# Patient Record
Sex: Male | Born: 1965 | Race: Black or African American | Hispanic: No | Marital: Married | Smoking: Former smoker
Health system: Southern US, Community
[De-identification: ages and names within clinical notes are randomized; demographics above are authoritative.]

## PROBLEM LIST (undated history)

## (undated) DIAGNOSIS — E785 Hyperlipidemia, unspecified: Secondary | ICD-10-CM

## (undated) DIAGNOSIS — I10 Essential (primary) hypertension: Secondary | ICD-10-CM

## (undated) DIAGNOSIS — T7840XA Allergy, unspecified, initial encounter: Secondary | ICD-10-CM

## (undated) DIAGNOSIS — E119 Type 2 diabetes mellitus without complications: Secondary | ICD-10-CM

## (undated) HISTORY — DX: Essential (primary) hypertension: I10

## (undated) HISTORY — DX: Type 2 diabetes mellitus without complications: E11.9

## (undated) HISTORY — DX: Allergy, unspecified, initial encounter: T78.40XA

## (undated) HISTORY — PX: NO PAST SURGERIES: SHX2092

---

## 2014-01-15 LAB — AMB EXT HGBA1C: Hemoglobin A1c, External: 7.4

## 2014-01-15 LAB — AMB EXT LDL-C: LDL-C, External: 112

## 2014-01-15 LAB — AMB EXT CREATININE: Creatinine, External: 1.2

## 2014-06-07 LAB — AMB POC URINALYSIS DIP STICK AUTO W/O MICRO
Bilirubin (UA POC): NEGATIVE
Blood (UA POC): NEGATIVE
Leukocyte esterase (UA POC): NEGATIVE
Nitrites (UA POC): NEGATIVE
Protein (UA POC): NEGATIVE mg/dL
Specific gravity (UA POC): 1.025 (ref 1.001–1.035)
Urobilinogen (UA POC): 0.2 (ref 0.2–1)
pH (UA POC): 5.5 (ref 4.6–8.0)

## 2014-06-07 LAB — AMB POC URINE, MICROALBUMIN, SEMIQUANT (5 RESULTS)
CREATININE, URINE POC: 300 mg/dL
Microalbumin urine (POC): 30 mg/L
Microalbumin/creat ratio (POC): 30 mg/g

## 2014-06-07 LAB — AMB POC GLUCOSE BLOOD, BY GLUCOSE MONITORING DEVICE: Glucose POC: 294 mg/dL

## 2014-06-07 MED ORDER — METFORMIN ER 1,000 MG 24 HR TABLET,EXTENDED RELEASE
1000 mg | ORAL_TABLET | Freq: Every day | ORAL | Status: DC
Start: 2014-06-07 — End: 2014-06-18

## 2014-06-07 MED ORDER — LISINOPRIL 20 MG TAB
20 mg | ORAL_TABLET | Freq: Every day | ORAL | Status: DC
Start: 2014-06-07 — End: 2014-06-18

## 2014-06-07 NOTE — Progress Notes (Signed)
Pt it also taking lisinopril

## 2014-06-07 NOTE — Progress Notes (Signed)
Chief Complaint   Patient presents with   ??? New Patient     establish a PCP           Subjective:   Jonathan Watts 48 y.o.  male with a  past medical history reviewed see below. comes in today to establish care   He was told by his last doctor and was started on three medications for his diabeties and he has not been taking the medication as he does not feel like he has diabeties he has been dx with HTN and was on two medications for the HTN he notes he has not taken any medications for 3 months and his home bp's are 160/105 and lowest 140/91.   C/o rightt sided back pain and numbness and tingling in hands when waking up and left shoulder pain no red flags     ROS: otherwise feeling generally well. All other systems reviewed and are negative      Current Outpatient Prescriptions   Medication Sig Dispense Refill   ??? ergocalciferol (ERGOCALCIFEROL) 50,000 unit capsule Take 1 Cap by mouth every seven (7) days. For twelve weeks once a week 12 Cap 0   ??? metFORMIN (GLUCOPHAGE) 500 mg tablet Take 1 Tab by mouth daily (with dinner). Use if your blood sugar is > 150 before dinner or take when you consume ETOH 30 Tab 0   ??? Blood-Glucose Meter (CONTOUR METER) monitoring kit Dx NIDDM 1 Kit 0   ??? glucose blood VI test strips (ASCENSIA CONTOUR) strip Use daily as directed 1 Each 11   ??? metoprolol succinate (TOPROL-XL) 25 mg XL tablet Take 1 Tab by mouth daily. 30 Tab 1   ??? lisinopril (PRINIVIL, ZESTRIL) 20 mg tablet Take 1 Tab by mouth daily. 30 Tab 1   ??? empagliflozin (JARDIANCE) 25 mg tablet Take 1 Tab by mouth daily. 30 Tab 4   ??? atorvastatin (LIPITOR) 10 mg tablet Take 1 Tab by mouth daily. 30 Tab 3     Allergies   Allergen Reactions   ??? Hay Fever & Allergy Relief Sneezing     Past Medical History   Diagnosis Date   ??? Hypertension    ??? Diabetes ([place])      History reviewed. No pertinent past surgical history.  Family History   Problem Relation Age of Onset   ??? Diabetes Mother    ??? Heart Disease Father      History    Substance Use Topics   ??? Smoking status: Never Smoker    ??? Smokeless tobacco: Never Used   ??? Alcohol Use: Yes          Objective:   BP 166/105 mmHg   Pulse 71   Temp(Src) 97.6 ??F (36.4 ??C) (Oral)   Resp 14   Wt 221 lb (100.245 kg)   SpO2 97%  Gen: NAD, pleasant  HEENT: normal appearing head, nares patent, PERRLA, EOMI, oropharynx no erythema, no cervical lymphadenopathy neck supple   Cardio: RRR nl S1S2 no murmur  Lungs CTAB no wheeze no rales no rhonchi  ABD Soft non tender non distended + bowel sounds  Extremities: full ROM X 4 no clubbing no cyanosis  Neuro: no gross focal deficits noted, alert and orientated X 3  Psych.: well groomed no outward signs of depression.    Contact dermatitis on belt buckle   Assessment/Plan:   Jonathan was seen today for new patient.    Diagnoses and associated orders for this visit:    Benign  essential HTN  - METABOLIC PANEL, COMPREHENSIVE  - AMB POC URINALYSIS DIP STICK AUTO W/O MICRO  - REFERRAL TO SLEEP STUDIES  - AMB POC EKG ROUTINE W/ 12 LEADS, INTER & REP  - [place] RETROPERITONEUM COMP; Future    Non-insulin dependent type 2 diabetes mellitus (HCC)  - HEMOGLOBIN A1C  - AMB POC URINE, MICROALBUMIN, SEMIQUANT (5 RESULTS)  - REFERRAL TO SLEEP STUDIES  - AMB POC EKG ROUTINE W/ 12 LEADS, INTER & REP  - AMB POC GLUCOSE BLOOD, BY GLUCOSE MONITORING DEVICE  - CK  - VITAMIN B12 & FOLATE  - LIPID PANEL    Screening  - VITAMIN D, 25 HYDROXY  - REFERRAL TO SLEEP STUDIES    Sleep concern  - REFERRAL TO SLEEP STUDIES    Abnormal EKG  - [place] RETROPERITONEUM COMP; Future  - 2D ECHO COMPLETE ADULT (TTE) W OR WO CONTR; Future    Right-sided low back pain without sciatica    Contact dermatitis due to nickel    Other Orders  - Discontinue: lisinopril (PRINIVIL, ZESTRIL) 20 mg tablet; Take 1 Tab by mouth daily.  - Discontinue: metFORMIN (GLUMETZA) 1,000 mg TG24 24 hour tablet; Take 1,000 mg by mouth daily.  - CVD REPORT  - DIABETES PATIENT EDUCATION        Follow-up Disposition:  Return in about 11 days  (around 06/18/2014) for 9:30 diabetic ed 30 minutes ..  avs printed and given to the pt..  Clear nail polish for belt buckle dermatitis   The patient voiced understanding of the above. Medication side effects were reviewed with the patient. Call with any concerns.

## 2014-06-07 NOTE — Patient Instructions (Addendum)
MyChart Activation    Thank you for requesting access to MyChart. Please follow the instructions below to securely access and download your online medical record. MyChart allows you to send messages to your doctor, view your test results, renew your prescriptions, schedule appointments, and more.    How Do I Sign Up?    1. In your internet browser, go to [email or web address]  2. Click on the First Time User? Click Here link in the Sign In box. You will be redirect to the New Member Sign Up page.  3. Enter your MyChart Access Code exactly as it appears below. You will not need to use this code after you???ve completed the sign-up process. If you do not sign up before the expiration date, you must request a new code.    MyChart Access Code: [email or web address]  Expires: 09/05/2014  9:10 AM (This is the date your MyChart access code will expire)    4. Enter the last four digits of your Social Security Number (xxxx) and Date of Birth (mm/dd/yyyy) as indicated and click Submit. You will be taken to the next sign-up page.  5. Create a MyChart ID. This will be your MyChart login ID and cannot be changed, so think of one that is secure and easy to remember.  6. Create a MyChart password. You can change your password at any time.  7. Enter your Password Reset Question and Answer. This can be used at a later time if you forget your password.   8. Enter your e-mail address. You will receive e-mail notification when new information is available in MyChart.  9. Click Sign Up. You can now view and download portions of your medical record.  10. Click the Download Summary menu link to download a portable copy of your medical information.     Warning signs of Stroke:        * Sudden numbness or weakness of the face, arm or leg, especially on one side of          The body            * Sudden confusion, trouble speaking or understanding        * Sudden trouble seeing in one or both eyes        * Sudden trouble walking, dizziness, loss of  balance or coordination        * Sudden severe headache with no known cause    It is important if these symptoms occur you call 911 immediately.    You can help lower your stroke risk by taking your blood pressure medications as directed daily, exercising, and limit salt intake in your diet.     Additional Information    If you have questions, please visit the Frequently Asked Questions section of the MyChart website at [email or web address] Remember, MyChart is NOT to be used for urgent needs. For medical emergencies, dial 911.       Warning signs of Stroke:        * Sudden numbness or weakness of the face, arm or leg, especially on one side of          The body            * Sudden confusion, trouble speaking or understanding        * Sudden trouble seeing in one or both eyes        * Sudden trouble walking, dizziness, loss of balance or coordination        *  Sudden severe headache with no known cause    It is important if these symptoms occur you call 911 immediately.    You can help lower your stroke risk by taking your blood pressure medications as directed daily, exercising, and limit salt intake in your diet  Stroke range is > 200/100!     [id number] [facility] [phone]

## 2014-06-09 LAB — METABOLIC PANEL, COMPREHENSIVE
A-G Ratio: 1.2 (ref 1.1–2.5)
ALT (SGPT): 24 IU/L (ref 0–44)
AST (SGOT): 18 IU/L (ref 0–40)
Albumin: 4.1 g/dL (ref 3.5–5.5)
Alk. phosphatase: 55 IU/L (ref 39–117)
BUN/Creatinine ratio: 9 (ref 9–20)
BUN: 10 mg/dL (ref 6–24)
Bilirubin, total: 0.2 mg/dL (ref 0.0–1.2)
CO2: 25 mmol/L (ref 18–29)
Calcium: 9.8 mg/dL (ref 8.7–10.2)
Chloride: 101 mmol/L (ref 97–108)
Creatinine: 1.17 mg/dL (ref 0.76–1.27)
GFR est AA: 85 mL/min/{1.73_m2} (ref >59)
GFR est non-AA: 74 mL/min/{1.73_m2} (ref >59)
GLOBULIN, TOTAL: 3.4 g/dL (ref 1.5–4.5)
Glucose: 241 mg/dL — ABNORMAL HIGH (ref 65–99)
Potassium: 4.6 mmol/L (ref 3.5–5.2)
Protein, total: 7.5 g/dL (ref 6.0–8.5)
Sodium: 141 mmol/L (ref 134–144)

## 2014-06-09 LAB — LIPID PANEL
Cholesterol, total: 226 mg/dL — ABNORMAL HIGH (ref 100–199)
HDL Cholesterol: 52 mg/dL (ref >39)
LDL, calculated: 134 mg/dL — ABNORMAL HIGH (ref 0–99)
Triglyceride: 199 mg/dL — ABNORMAL HIGH (ref 0–149)
VLDL, calculated: 40 mg/dL (ref 5–40)

## 2014-06-09 LAB — CK: Creatine Kinase,Total: 336 U/L — ABNORMAL HIGH (ref 24–204)

## 2014-06-09 LAB — VITAMIN B12 & FOLATE
Folate: 9.8 ng/mL (ref >3.0)
Vitamin B12: 560 pg/mL (ref 211–946)

## 2014-06-09 LAB — VITAMIN D, 25 HYDROXY: VITAMIN D, 25-HYDROXY: 18.5 ng/mL — ABNORMAL LOW (ref 30.0–100.0)

## 2014-06-09 LAB — HEMOGLOBIN A1C WITH EAG: Hemoglobin A1c: 9.5 % — ABNORMAL HIGH (ref 4.8–5.6)

## 2014-06-09 LAB — CVD REPORT

## 2014-06-18 MED ORDER — EMPAGLIFLOZIN 25 MG TABLET
25 mg | ORAL_TABLET | Freq: Every day | ORAL | Status: DC
Start: 2014-06-18 — End: 2014-08-23

## 2014-06-18 MED ORDER — ATORVASTATIN 10 MG TAB
10 mg | ORAL_TABLET | Freq: Every day | ORAL | Status: DC
Start: 2014-06-18 — End: 2014-08-23

## 2014-06-18 MED ORDER — METOPROLOL SUCCINATE SR 25 MG 24 HR TAB
25 mg | ORAL_TABLET | Freq: Every day | ORAL | Status: DC
Start: 2014-06-18 — End: 2014-08-23

## 2014-06-18 MED ORDER — LISINOPRIL 20 MG TAB
20 mg | ORAL_TABLET | Freq: Every day | ORAL | Status: DC
Start: 2014-06-18 — End: 2014-08-23

## 2014-06-18 NOTE — Progress Notes (Signed)
Subjective:     Jonathan Watts is a 48 y.o. male presenting for annual exam and complete physical.  Has been having cramps on the metformin and present for since being on the medication. He restarted the metofrmin after the last appt.  Monitoring home bps and they are elevated   Patient Active Problem List    Diagnosis Date Noted   ??? Contact dermatitis due to nickel 06/22/2014   ??? Right-sided low back pain without sciatica 06/22/2014   ??? Sleep concern 06/22/2014   ??? Abnormal EKG 06/22/2014   ??? Dyslipidemia 06/21/2014   ??? Benign essential HTN 06/07/2014   ??? Non-insulin dependent type 2 diabetes mellitus ([place]) 06/07/2014     Current Outpatient Prescriptions   Medication Sig Dispense Refill   ??? metoprolol succinate (TOPROL-XL) 25 mg XL tablet Take 1 Tab by mouth daily. 30 Tab 1   ??? lisinopril (PRINIVIL, ZESTRIL) 20 mg tablet Take 1 Tab by mouth daily. 30 Tab 1   ??? empagliflozin (JARDIANCE) 25 mg tablet Take 1 Tab by mouth daily. 30 Tab 4   ??? atorvastatin (LIPITOR) 10 mg tablet Take 1 Tab by mouth daily. 30 Tab 3   ??? ergocalciferol (ERGOCALCIFEROL) 50,000 unit capsule Take 1 Cap by mouth every seven (7) days. For twelve weeks once a week 12 Cap 0   ??? metFORMIN (GLUCOPHAGE) 500 mg tablet Take 1 Tab by mouth daily (with dinner). Use if your blood sugar is > 150 before dinner or take when you consume ETOH 30 Tab 0   ??? Blood-Glucose Meter (CONTOUR METER) monitoring kit Dx NIDDM 1 Kit 0   ??? glucose blood VI test strips (ASCENSIA CONTOUR) strip Use daily as directed 1 Each 11     Allergies   Allergen Reactions   ??? Hay Fever & Allergy Relief Sneezing     Past Medical History   Diagnosis Date   ??? Hypertension    ??? Diabetes ([place])      No past surgical history on file.  Family History   Problem Relation Age of Onset   ??? Diabetes Mother    ??? Heart Disease Father      History   Substance Use Topics   ??? Smoking status: Never Smoker    ??? Smokeless tobacco: Never Used   ??? Alcohol Use: Yes             Review of Systems  A  comprehensive review of systems was negative except for that written in the HPI.    Objective:     BP 147/97 mmHg   Pulse 74   Temp(Src) 96.8 ??F (36 ??C) (Oral)   Resp 12   Wt 216 lb 11.2 oz (98.294 kg)   SpO2 96%  Physical exam:   BP 147/97 mmHg   Pulse 74   Temp(Src) 96.8 ??F (36 ??C) (Oral)   Resp 12   Ht _0  (1.854 m)   Wt 216 lb 11.2 oz (98.294 kg)   BMI 28.60 kg/m2   SpO2 96%  General:  Alert, cooperative, no distress, appears stated age.   Head:  Normocephalic, without obvious abnormality, atraumatic.   Eyes:  Conjunctivae/corneas clear. PERRL, EOMs intact. Fundi benign   Ears:  Normal TMs and external ear canals both ears.   Nose: Nares normal. Septum midline. Mucosa normal. No drainage or sinus tenderness.   Throat: Lips, mucosa, and tongue normal. Teeth and gums normal.   Neck: Supple, symmetrical, trachea midline, no adenopathy, thyroid: no enlargement/tenderness/nodules, no carotid  bruit and no JVD.   Back:   Symmetric, no curvature. ROM normal. No CVA tenderness.   Lungs:   Clear to auscultation bilaterally.   Chest wall:  No tenderness or deformity.   Heart:  Regular rate and rhythm, S1, S2 normal, no murmur, click, rub or gallop.   Abdomen:   Soft, non-tender. Bowel sounds normal. No masses,  No organomegaly.   Genitalia:  Normal male without lesion, discharge or tenderness.   Rectal:  Normal tone, normal prostate, no masses or tenderness  Guaiac negative stool.   Extremities: Extremities normal, atraumatic, no cyanosis or edema.   Pulses: 2+ and symmetric all extremities.   Skin: Skin color, texture, turgor normal. No rashes or lesions   Lymph nodes: Cervical, supraclavicular, and axillary nodes normal.   Neurologic: CNII-XII intact. Normal strength, sensation and reflexes throughout.        Assessment/Plan:   [name] was seen today for hypertension.    Diagnoses and associated orders for this visit:    Well adult on routine health check    Uncontrolled diabetes mellitus ([place])    Cramp of both lower  extremities    Vitamin D deficiency    Other Orders  - metoprolol succinate (TOPROL-XL) 25 mg XL tablet; Take 1 Tab by mouth daily.  - lisinopril (PRINIVIL, ZESTRIL) 20 mg tablet; Take 1 Tab by mouth daily.  - empagliflozin (JARDIANCE) 25 mg tablet; Take 1 Tab by mouth daily.  - atorvastatin (LIPITOR) 10 mg tablet; Take 1 Tab by mouth daily.        Follow-up Disposition:  Return in about 5 weeks (around 07/23/2014) for 9 am  diabetic education and recheck HTN ..  lose weight, increase physical activity, limit alcohol consumption.   Immunizations reviewed.  Pt prefers monday's as he is off then   The patient voiced understanding of the above. Medication side effects were reviewed with the patient. Call with any concerns.

## 2014-06-18 NOTE — Patient Instructions (Addendum)
Call your insurance company and find out which test strips are tier 1- call Kina  (my nurse) back or send a note via mychart with what is covered if you do not ave test strips covered we can get you a contour meter (15-20$)     Complete physical exam advise:    Walk daily at least 30 minutes if your not already doing so this will help lower your blood sugars and your cholesterol. If your trying to lose weight you need cardiovascular exercise for 60 minutes 6 days a week!  Limit fried foods and red meat to once a week  Schedule eye exam and dental exam if due  Do not text and drive  Read about calcium and vitamin D requirement further at [email or web address]  Take a multivitamin.  laugh daily!     Warning signs of Stroke:        * Sudden numbness or weakness of the face, arm or leg, especially on one side of          The body            * Sudden confusion, trouble speaking or understanding        * Sudden trouble seeing in one or both eyes        * Sudden trouble walking, dizziness, loss of balance or coordination        * Sudden severe headache with no known cause    It is important if these symptoms occur you call 911 immediately.    You can help lower your stroke risk by taking your blood pressure medications as directed daily, exercising, and limit salt intake in your diet.     Blood pressure is > 200/100 stroke range     If your blood pressure is elevated try and drink of water   Call the doctor on call       Stop the metformin for now start jardience (new diabetic medication)   continue to hold the Norvasc start the metoprolol XL

## 2014-06-21 ENCOUNTER — Encounter

## 2014-06-21 MED ORDER — METFORMIN 500 MG TAB
500 mg | ORAL_TABLET | Freq: Every day | ORAL | Status: DC
Start: 2014-06-21 — End: 2014-10-18

## 2014-06-21 MED ORDER — BLOOD GLUCOSE METER KIT
PACK | Status: AC
Start: 2014-06-21 — End: ?

## 2014-06-21 MED ORDER — BLOOD SUGAR DIAGNOSTIC TEST STRIPS
Status: DC
Start: 2014-06-21 — End: 2014-07-06

## 2014-06-21 NOTE — Progress Notes (Signed)
Chief Complaint   Patient presents with   ??? Other     blood  work           Subjective:   Jonathan Watts 48 y.o.  male with a  past medical history reviewed see below. comes in today c/o: for f/up he is not fasting and has some questions about diabetes his wife has changed his diet he is not exercising yet and was told he could have any meter with his insurance  He just started the diabetes medication yesterday he has no other concerns   ROS: otherwise feeling generally well. All other systems reviewed and are negative      Current Outpatient Prescriptions   Medication Sig Dispense Refill   ??? metoprolol succinate (TOPROL-XL) 25 mg XL tablet Take 1 Tab by mouth daily. 30 Tab 1   ??? lisinopril (PRINIVIL, ZESTRIL) 20 mg tablet Take 1 Tab by mouth daily. 30 Tab 1   ??? atorvastatin (LIPITOR) 10 mg tablet Take 1 Tab by mouth daily. 30 Tab 3   ??? empagliflozin (JARDIANCE) 25 mg tablet Take 1 Tab by mouth daily. 30 Tab 4     Allergies   Allergen Reactions   ??? Hay Fever & Allergy Relief Sneezing     Past Medical History   Diagnosis Date   ??? Hypertension    ??? Diabetes ([place])      No past surgical history on file.  No family history on file.  History   Substance Use Topics   ??? Smoking status: Never Smoker    ??? Smokeless tobacco: Never Used   ??? Alcohol Use: Yes          Objective:   BP 142/83 mmHg   Pulse 73   Temp(Src) 97 ??F (36.1 ??C) (Oral)   Resp 14   Wt 216 lb (97.977 kg)   SpO2 95%  Gen: NAD, pleasant  HEENT: normal appearing head, nares patent, PERRLA, EOMI, oropharynx no erythema, no cervical lymphadenopathy neck supple   Cardio: RRR nl S1S2 no murmur  Lungs CTAB no wheeze no rales no rhonchi  Extremities: full ROM X 4 no clubbing no cyanosis  Neuro: no gross focal deficits noted, alert and orientated X 3  Psych.: well groomed no outward signs of depression.      Assessment/Plan:   Jonathan Watts was seen today for other.    Diagnoses and associated orders for this visit:    Non-insulin dependent type 2 diabetes mellitus ([place])     Dyslipidemia  - APOLIPOPROTEIN B/A RATIO; Future  - TRIGLYCERIDE; Future  - CK; Future    Other Orders  - metFORMIN (GLUCOPHAGE) 500 mg tablet; Take 1 Tab by mouth daily (with dinner). Use if your blood sugar is > 150 before dinner or take when you consume ETOH  - Blood-Glucose Meter (CONTOUR METER) monitoring kit; Dx NIDDM  - glucose blood VI test strips (ASCENSIA CONTOUR) strip; Use daily as directed        Follow-up Disposition:  Return in about 7 days (around 06/28/2014) for lab only appt  on monday and a one month appt with me fo r15 minutes recheck bs ..  avs printed and given to the pt.routine diabetic questions answered gave pt h/o on hypoglycemic concerns enocuraged cv exercise d/w pt to use metformin with high bs or when consuming etoh  The patient voiced understanding of the above. Medication side effects were reviewed with the patient. Call with any concerns.

## 2014-06-21 NOTE — Patient Instructions (Signed)
Any concerns send a note via mychart.

## 2014-06-21 NOTE — Progress Notes (Signed)
Pt put in wrong not a lab only appt

## 2014-06-28 NOTE — Progress Notes (Signed)
Lab only

## 2014-06-28 NOTE — Patient Instructions (Signed)
Lab only

## 2014-06-29 LAB — APOLIPOPROTEIN B/A RATIO
Apolipo. B/A-1 Ratio: 0.5 ratio units (ref 0.0–0.7)
Apolipoprotein A-1: 156 mg/dL (ref 110–180)
Apolipoprotein B: 83 mg/dL — ABNORMAL HIGH (ref 0–79)

## 2014-06-29 LAB — TRIGLYCERIDE: Triglyceride: 114 mg/dL (ref 0–149)

## 2014-06-29 LAB — CK: Creatine Kinase,Total: 409 U/L — ABNORMAL HIGH (ref 24–204)

## 2014-07-06 MED ORDER — BLOOD SUGAR DIAGNOSTIC TEST STRIPS
Status: DC
Start: 2014-07-06 — End: 2014-10-18

## 2014-07-14 NOTE — Progress Notes (Signed)
Nurse spoke with insurance company and test strips have been approved

## 2014-07-23 LAB — AMB POC GLUCOSE BLOOD, BY GLUCOSE MONITORING DEVICE: Glucose POC: 128 mg/dL

## 2014-07-23 MED ORDER — AMLODIPINE 5 MG TAB
5 mg | ORAL_TABLET | Freq: Every day | ORAL | Status: DC
Start: 2014-07-23 — End: 2014-08-23

## 2014-07-23 NOTE — Patient Instructions (Addendum)
Diabetes Foot Care: After Your Visit  Your Care Instructions     When you have diabetes, your feet need extra care and attention. Diabetes can damage the nerve endings and blood vessels in your feet, making you less likely to notice when your feet are injured. Diabetes also limits your body's ability to fight infection and get blood to areas that need it. If you get a minor foot injury, it could become an ulcer or a serious infection. With good foot care, you can prevent most of these problems.  Caring for your feet can be quick and easy. Most of the care can be done when you are bathing or getting ready for bed.  Follow-up care is a key part of your treatment and safety. Be sure to make and go to all appointments, and call your doctor if you are having problems. It???s also a good idea to know your test results and keep a list of the medicines you take.  How can you care for yourself at home?  ?? Keep your blood sugar close to normal by watching what and how much you eat, monitoring blood sugar, taking medicines if prescribed, and getting regular exercise.  ?? Do not smoke. Smoking affects blood flow and can make foot problems worse. If you need help quitting, talk to your doctor about stop-smoking programs and medicines. These can increase your chances of quitting for good.  ?? Eat a diet that is low in fats. High fat intake can cause fat to build up in your blood vessels and decrease blood flow.  ?? Inspect your feet daily for blisters, cuts, cracks, or sores. If you cannot see well, use a mirror or have someone help you.  ?? Take care of your feet:  ?? Wash your feet every day. Use warm (not hot) water. Check the water temperature with your wrists or other part of your body, not your feet.  ?? Dry your feet well. Pat them dry. Do not rub the skin on your feet too hard. Dry well between your toes. If the skin on your feet stays moist, bacteria or a fungus can grow, which can lead to infection.   ?? Keep your skin soft. Use moisturizing skin cream to keep the skin on your feet soft and prevent calluses and cracks. But do not put the cream between your toes, and stop using any cream that causes a rash.  ?? Clean underneath your toenails carefully. Do not use a sharp object to clean underneath your toenails. Use the blunt end of a nail file or other rounded tool.  ?? Trim and file your toenails straight across to prevent ingrown toenails. Use a nail clipper, not scissors. Use an emery board to smooth the edges.  ?? Change socks daily. Socks without seams are best, because seams often rub the feet. You can find socks for people with diabetes from specialty catalogs.  ?? Look inside your shoes every day for things like gravel or torn linings, which could cause blisters or sores.  ?? Buy shoes that fit well:  ?? Look for shoes that have plenty of space around the toes. This helps prevent bunions and blisters.  ?? Try on shoes while wearing the kind of socks you will usually wear with the shoes.  ?? Avoid plastic shoes. They may rub your feet and cause blisters. Good shoes should be made of materials that are flexible and breathable, such as leather or cloth.  ?? Break in new shoes slowly   by wearing them for no more than an hour a day for several days. Take extra time to check your feet for red areas, blisters, or other problems after you wear new shoes.  ?? Do not go barefoot. Do not wear sandals, and do not wear shoes with very thin soles. Thin soles are easy to puncture. They also do not protect your feet from hot pavement or cold weather.  ?? Have your doctor check your feet during each visit. If you have a foot problem, see your doctor. Do not try to treat an early foot problem at home. Home remedies or treatments that you can buy without a prescription (such as corn removers) can be harmful.  ?? Always get early treatment for foot problems. A minor irritation can lead to a major problem if not properly cared for early.   When should you call for help?  Call your doctor now or seek immediate medical care if:  ?? You have a foot sore, an ulcer or break in the skin that is not healing after 4 days, bleeding corns or calluses, or an ingrown toenail.  ?? You have blue or black areas, which can mean bruising or blood flow problems.  ?? You have peeling skin or tiny blisters between your toes or cracking or oozing of the skin.  ?? You have a fever for more than 24 hours and a foot sore.  ?? You have new numbness or tingling in your feet that does not go away after you move your feet or change positions.  ?? You have unexplained or unusual swelling of the foot or ankle.  Watch closely for changes in your health, and be sure to contact your doctor if:  ?? You cannot do proper foot care.   Where can you learn more?   Go to [email or web address]  Enter A739 in the search box to learn more about "Diabetes Foot Care: After Your Visit."   ?? 2006-2015 [employer]. Care instructions adapted under license by [employer] (which disclaims liability or warranty for this information). This care instruction is for use with your licensed healthcare professional. If you have questions about a medical condition or this instruction, always ask your healthcare professional. [employer] Inc[employer] disclaims any warranty or liability for your use of this information.  Content Version: 10.5.422740; Current as of: November 13, 2013            Goal for bp is around 130/80 if your blood pressure is < 100/60 or you have symptoms of lightheadedness  fatigue confusion stop the new pill

## 2014-07-23 NOTE — Progress Notes (Signed)
Chief Complaint   Patient presents with   ??? Hypertension   ??? Diabetes           Subjective:   Jonathan Watts 48 y.o.  male with a  past medical history reviewed see below. comes in today for f/up dm Using jardiance and metofrin only prn and bs are running fine  C/o increased stress deneis sucidla ideation or homicidal ideations   ROS: otherwise feeling generally well. All other systems reviewed and are negative      Current Outpatient Prescriptions   Medication Sig Dispense Refill   ??? amLODIPine (NORVASC) 5 mg tablet Take 1 Tab by mouth daily. 30 Tab 0   ??? glucose blood VI test strips (ASCENSIA CONTOUR) strip Use daily as directed 1 Each 11   ??? ergocalciferol (ERGOCALCIFEROL) 50,000 unit capsule Take 1 Cap by mouth every seven (7) days. For twelve weeks once a week 12 Cap 0   ??? metFORMIN (GLUCOPHAGE) 500 mg tablet Take 1 Tab by mouth daily (with dinner). Use if your blood sugar is > 150 before dinner or take when you consume ETOH 30 Tab 0   ??? Blood-Glucose Meter (CONTOUR METER) monitoring kit Dx NIDDM 1 Kit 0   ??? metoprolol succinate (TOPROL-XL) 25 mg XL tablet Take 1 Tab by mouth daily. 30 Tab 1   ??? lisinopril (PRINIVIL, ZESTRIL) 20 mg tablet Take 1 Tab by mouth daily. 30 Tab 1   ??? empagliflozin (JARDIANCE) 25 mg tablet Take 1 Tab by mouth daily. 30 Tab 4   ??? atorvastatin (LIPITOR) 10 mg tablet Take 1 Tab by mouth daily. 30 Tab 3     Allergies   Allergen Reactions   ??? Hay Fever & Allergy Relief Sneezing     Past Medical History   Diagnosis Date   ??? Hypertension    ??? Diabetes ([place])      No past surgical history on file.  Family History   Problem Relation Age of Onset   ??? Diabetes Mother    ??? Heart Disease Father      History   Substance Use Topics   ??? Smoking status: Never Smoker    ??? Smokeless tobacco: Never Used   ??? Alcohol Use: Yes          Objective:   BP 151/98 mmHg   Pulse 64   Temp(Src) 97 ??F (36.1 ??C) (Oral)   Resp 12   Wt 210 lb (95.255 kg)   SpO2 98%  Gen: NAD, pleasant   HEENT: normal appearing head, nares patent, PERRLA, EOMI, oropharynx no erythema, no cervical lymphadenopathy neck supple   Cardio: RRR nl S1S2 no murmur  Lungs CTAB no wheeze no rales no rhonchi  ABD Soft non tender non distended + bowel sounds  Extremities: full ROM X 4 no clubbing no cyanosis  Neuro: no gross focal deficits noted, alert and orientated X 3  Psych.: well groomed no outward signs of depression.  Foot exam decreased vibratory sensation monofilment 12/12   Toe nail fungus.    Assessment/Plan:   [name] was seen today for hypertension and diabetes.    Diagnoses and associated orders for this visit:    Non-insulin dependent type 2 diabetes mellitus (HCC)  - AMB POC GLUCOSE BLOOD, BY GLUCOSE MONITORING DEVICE  - REFERRAL TO DIABETIC EDUCATION    Benign essential HTN    Stress    Toenail fungus    Other Orders  - amLODIPine (NORVASC) 5 mg tablet; Take 1 Tab by mouth  daily.        Follow-up Disposition:  Return in about 1 month (around 08/23/2014) for recheck bp 15 min .Marland Kitchen  avs printed and given to the pt..  Stress reliving techiniques. startednorvasc   The patient voiced understanding of the above. Medication side effects were reviewed with the patient. Call with any concerns.

## 2014-08-05 NOTE — Telephone Encounter (Signed)
The correct test strips has been called into the pharmacy

## 2014-08-23 MED ORDER — AMLODIPINE 5 MG TAB
5 mg | ORAL_TABLET | Freq: Every day | ORAL | Status: DC
Start: 2014-08-23 — End: 2014-10-18

## 2014-08-23 MED ORDER — EMPAGLIFLOZIN 25 MG TABLET
25 mg | ORAL_TABLET | Freq: Every day | ORAL | Status: DC
Start: 2014-08-23 — End: 2014-10-18

## 2014-08-23 MED ORDER — LISINOPRIL 20 MG TAB
20 mg | ORAL_TABLET | Freq: Every day | ORAL | Status: DC
Start: 2014-08-23 — End: 2014-10-18

## 2014-08-23 MED ORDER — ATORVASTATIN 10 MG TAB
10 mg | ORAL_TABLET | Freq: Every day | ORAL | Status: DC
Start: 2014-08-23 — End: 2014-10-18

## 2014-08-23 MED ORDER — METOPROLOL SUCCINATE SR 25 MG 24 HR TAB
25 mg | ORAL_TABLET | Freq: Every day | ORAL | Status: DC
Start: 2014-08-23 — End: 2014-10-18

## 2014-08-23 NOTE — Progress Notes (Signed)
Chief Complaint   Patient presents with   ??? Hypertension       Subjective:   Jonathan Watts 48 y.o.  male with a  past medical history reviewed see below. comes in today for bp check home bp's 130/80 on weekends and when at work his bp is elevated to 150/98-100.   He notes that he has stress is still present at work and a bit better since talking with his supervisor.       ROS: otherwise feeling generally well. All other systems reviewed and are negative      Current Outpatient Prescriptions   Medication Sig Dispense Refill   ??? amLODIPine (NORVASC) 5 mg tablet Take 1 Tab by mouth daily. 30 Tab 3   ??? metoprolol succinate (TOPROL-XL) 25 mg XL tablet Take 1 Tab by mouth daily. 30 Tab 3   ??? lisinopril (PRINIVIL, ZESTRIL) 20 mg tablet Take 1 Tab by mouth daily. 30 Tab 3   ??? atorvastatin (LIPITOR) 10 mg tablet Take 1 Tab by mouth daily. 30 Tab 3   ??? empagliflozin (JARDIANCE) 25 mg tablet Take 1 Tab by mouth daily. 30 Tab 3   ??? glucose blood VI test strips (ASCENSIA CONTOUR) strip Use daily as directed 1 Each 11   ??? ergocalciferol (ERGOCALCIFEROL) 50,000 unit capsule Take 1 Cap by mouth every seven (7) days. For twelve weeks once a week 12 Cap 0   ??? metFORMIN (GLUCOPHAGE) 500 mg tablet Take 1 Tab by mouth daily (with dinner). Use if your blood sugar is > 150 before dinner or take when you consume ETOH 30 Tab 0   ??? Blood-Glucose Meter (CONTOUR METER) monitoring kit Dx NIDDM 1 Kit 0     Allergies   Allergen Reactions   ??? Hay Fever & Allergy Relief Sneezing     Past Medical History   Diagnosis Date   ??? Hypertension    ??? Diabetes ([place])      No past surgical history on file.  Family History   Problem Relation Age of Onset   ??? Diabetes Mother    ??? Heart Disease Father      History   Substance Use Topics   ??? Smoking status: Never Smoker    ??? Smokeless tobacco: Never Used   ??? Alcohol Use: Yes          Objective:   BP 139/98 mmHg   Pulse 63   Temp(Src) 97.5 ??F (36.4 ??C) (Oral)   Resp 20    Ht '6\' 1"'  (1.854 m)   Wt 210 lb (95.255 kg)   BMI 27.71 kg/m2   SpO2 99%  Gen: NAD, pleasant  HEENT: normal appearing head, nares patent, PERRLA, EOMI, oropharynx no erythema, no cervical lymphadenopathy neck supple   Cardio: RRR nl S1S2 no murmur  Lungs CTAB no wheeze no rales no rhonchi  ABD Soft non tender non distended + bowel sounds  Extremities: full ROM X 4 no clubbing no cyanosis  Neuro: no gross focal deficits noted, alert and orientated X 3  Psych.: well groomed some depression appears a bit stressed .      Assessment/Plan:   [name] was seen today for hypertension.    Diagnoses and associated orders for this visit:    BP check    Stress at work  - REFERRAL TO PSYCHOLOGY    Chronic diastolic congestive heart failure (HCC)    Other Orders  - amLODIPine (NORVASC) 5 mg tablet; Take 1 Tab by mouth daily.  -  metoprolol succinate (TOPROL-XL) 25 mg XL tablet; Take 1 Tab by mouth daily.  - lisinopril (PRINIVIL, ZESTRIL) 20 mg tablet; Take 1 Tab by mouth daily.  - atorvastatin (LIPITOR) 10 mg tablet; Take 1 Tab by mouth daily.  - empagliflozin (JARDIANCE) 25 mg tablet; Take 1 Tab by mouth daily.        Follow-up Disposition:  Return in about 6 weeks (around 10/04/2014) for recheck DM and bp stress 8 am please 15 min fasting labs needed ..  avs printed and given to the pt Did not take am bp meds  The patient voiced understanding of the above. Medication side effects were reviewed with the patient. Call with any concerns.

## 2014-08-23 NOTE — Patient Instructions (Addendum)
Send me a note via mychart if you would like the stress pill     Your blood sugar should be around 120 first thing in the am and around 140 2 hours after   If your blood sugar is < 70 fasting that too low or with symptoms !    Please get your sleep study.   Come in fasting for the next appt

## 2014-10-04 ENCOUNTER — Ambulatory Visit
Admit: 2014-10-04 | Discharge: 2014-10-04 | Payer: PRIVATE HEALTH INSURANCE | Attending: Family Medicine | Primary: Family Medicine

## 2014-10-04 DIAGNOSIS — E119 Type 2 diabetes mellitus without complications: Secondary | ICD-10-CM

## 2014-10-04 LAB — AMB POC GLUCOSE BLOOD, BY GLUCOSE MONITORING DEVICE: Glucose POC: 158 mg/dL

## 2014-10-04 NOTE — Progress Notes (Signed)
Chief Complaint   Patient presents with   ??? Hypertension   ??? Diabetes           Subjective:   Jonathan Watts 48 y.o.  male with a  past medical history reviewed see below. comes in today for a bp check notes home bps are good disastolic around 85. He has cut back on high processed foods. His wife has cut back on his sugars as well.  He has no been working out like he used too.  He is using his meds as directed.  No medication side effects he has been doing stress exercises as well and has been happier at work.   Declines flu shot today   ROS: otherwise feeling generally well. All other systems reviewed and are negative      Current Outpatient Prescriptions   Medication Sig Dispense Refill   ??? amLODIPine (NORVASC) 5 mg tablet Take 1 Tab by mouth daily. 30 Tab 3   ??? metoprolol succinate (TOPROL-XL) 25 mg XL tablet Take 1 Tab by mouth daily. 30 Tab 3   ??? lisinopril (PRINIVIL, ZESTRIL) 20 mg tablet Take 1 Tab by mouth daily. 30 Tab 3   ??? empagliflozin (JARDIANCE) 25 mg tablet Take 1 Tab by mouth daily. 30 Tab 3   ??? glucose blood VI test strips (ASCENSIA CONTOUR) strip Use daily as directed 1 Each 11   ??? ergocalciferol (ERGOCALCIFEROL) 50,000 unit capsule Take 1 Cap by mouth every seven (7) days. For twelve weeks once a week 12 Cap 0   ??? Blood-Glucose Meter (CONTOUR METER) monitoring kit Dx NIDDM 1 Kit 0   ??? atorvastatin (LIPITOR) 10 mg tablet Take 1 Tab by mouth daily. 30 Tab 3   ??? metFORMIN (GLUCOPHAGE) 500 mg tablet Take 1 Tab by mouth daily (with dinner). Use if your blood sugar is > 150 before dinner or take when you consume ETOH 30 Tab 0     Allergies   Allergen Reactions   ??? Hay Fever & Allergy Relief Sneezing     Past Medical History   Diagnosis Date   ??? Hypertension    ??? Diabetes ([place])      No past surgical history on file.  Family History   Problem Relation Age of Onset   ??? Diabetes Mother    ??? Heart Disease Father      History   Substance Use Topics   ??? Smoking status: Never Smoker     ??? Smokeless tobacco: Never Used   ??? Alcohol Use: Yes          Objective:   BP 128/88 mmHg   Pulse 58   Temp(Src) 97 ??F (36.1 ??C) (Oral)   Resp 14   Wt 209 lb (94.802 kg)   SpO2 98%  Gen: NAD, pleasant  HEENT: normal appearing head, nares patent, PERRLA, EOMI, oropharynx no erythema, no cervical lymphadenopathy neck supple   Cardio: RRR nl S1S2 no murmur  Lungs CTAB no wheeze no rales no rhonchi  ABD Soft non tender non distended + bowel sounds  Extremities: full ROM X 4 no clubbing no cyanosis  Neuro: no gross focal deficits noted, alert and orientated X 3  Psych.: well groomed mood excellent today less stressed on appearance       Assessment/Plan:   Jonathan was seen today for hypertension and diabetes.    Diagnoses and associated orders for this visit:    Non-insulin dependent type 2 diabetes mellitus ([place])  - AMB POC GLUCOSE BLOOD,  BY GLUCOSE MONITORING DEVICE  - HEMOGLOBIN A1C  - LIPID PANEL  - METABOLIC PANEL, COMPREHENSIVE  - REFERRAL TO OPHTHALMOLOGY    Benign essential HTN  - LIPID PANEL  - METABOLIC PANEL, COMPREHENSIVE    Dyslipidemia  - HEMOGLOBIN A1C  - LIPID PANEL  - METABOLIC PANEL, COMPREHENSIVE    Hypovitaminosis D  - VITAMIN D, 25 HYDROXY  - PTH INTACT        Follow-up Disposition:  Return in about 2 weeks (around 10/18/2014) for 15 min review LABS .Marland Kitchen  avs printed and given to the pt.Doristine Devoid job with diet changes and stress relieving techniques increase cv exercise repeat bp at goal no changes will refer for an eye exam   The patient voiced understanding of the above. Medication side effects were reviewed with the patient. Call with any concerns.

## 2014-10-04 NOTE — Patient Instructions (Signed)
Great job with your diet please increase your exercise!!

## 2014-10-05 LAB — PTH INTACT: PTH, Intact: 30 pg/mL (ref 15–65)

## 2014-10-05 LAB — METABOLIC PANEL, COMPREHENSIVE
A-G Ratio: 1.2 (ref 1.1–2.5)
ALT (SGPT): 21 IU/L (ref 0–44)
AST (SGOT): 20 IU/L (ref 0–40)
Albumin: 4.1 g/dL (ref 3.5–5.5)
Alk. phosphatase: 55 IU/L (ref 39–117)
BUN/Creatinine ratio: 10 (ref 9–20)
BUN: 12 mg/dL (ref 6–24)
Bilirubin, total: 0.3 mg/dL (ref 0.0–1.2)
CO2: 25 mmol/L (ref 18–29)
Calcium: 9.6 mg/dL (ref 8.7–10.2)
Chloride: 101 mmol/L (ref 97–108)
Creatinine: 1.24 mg/dL (ref 0.76–1.27)
GFR est AA: 80 mL/min/{1.73_m2} (ref >59)
GFR est non-AA: 69 mL/min/{1.73_m2} (ref >59)
GLOBULIN, TOTAL: 3.3 g/dL (ref 1.5–4.5)
Glucose: 146 mg/dL — ABNORMAL HIGH (ref 65–99)
Potassium: 4.6 mmol/L (ref 3.5–5.2)
Protein, total: 7.4 g/dL (ref 6.0–8.5)
Sodium: 142 mmol/L (ref 134–144)

## 2014-10-05 LAB — CVD REPORT

## 2014-10-05 LAB — VITAMIN D, 25 HYDROXY: VITAMIN D, 25-HYDROXY: 25.7 ng/mL — ABNORMAL LOW (ref 30.0–100.0)

## 2014-10-05 LAB — LIPID PANEL
Cholesterol, total: 169 mg/dL (ref 100–199)
HDL Cholesterol: 63 mg/dL (ref >39)
LDL, calculated: 90 mg/dL (ref 0–99)
Triglyceride: 80 mg/dL (ref 0–149)
VLDL, calculated: 16 mg/dL (ref 5–40)

## 2014-10-05 LAB — HEMOGLOBIN A1C WITH EAG: Hemoglobin A1c: 8.7 % — ABNORMAL HIGH (ref 4.8–5.6)

## 2014-10-18 ENCOUNTER — Ambulatory Visit
Admit: 2014-10-18 | Discharge: 2014-10-18 | Payer: PRIVATE HEALTH INSURANCE | Attending: Family Medicine | Primary: Family Medicine

## 2014-10-18 DIAGNOSIS — E559 Vitamin D deficiency, unspecified: Secondary | ICD-10-CM

## 2014-10-18 MED ORDER — BLOOD SUGAR DIAGNOSTIC TEST STRIPS
ORAL_STRIP | Status: DC
Start: 2014-10-18 — End: 2015-03-07

## 2014-10-18 MED ORDER — AMLODIPINE 5 MG TAB
5 mg | ORAL_TABLET | Freq: Every day | ORAL | Status: DC
Start: 2014-10-18 — End: 2015-02-21

## 2014-10-18 MED ORDER — METFORMIN 500 MG TAB
500 mg | ORAL_TABLET | Freq: Every day | ORAL | Status: DC
Start: 2014-10-18 — End: 2015-07-27

## 2014-10-18 MED ORDER — LISINOPRIL 20 MG TAB
20 mg | ORAL_TABLET | Freq: Every day | ORAL | Status: DC
Start: 2014-10-18 — End: 2015-02-21

## 2014-10-18 MED ORDER — METOPROLOL SUCCINATE SR 25 MG 24 HR TAB
25 mg | ORAL_TABLET | Freq: Every day | ORAL | Status: DC
Start: 2014-10-18 — End: 2015-02-21

## 2014-10-18 MED ORDER — EMPAGLIFLOZIN 25 MG TABLET
25 mg | ORAL_TABLET | Freq: Every day | ORAL | Status: DC
Start: 2014-10-18 — End: 2014-12-06

## 2014-10-18 MED ORDER — ATORVASTATIN 10 MG TAB
10 mg | ORAL_TABLET | Freq: Every day | ORAL | Status: DC
Start: 2014-10-18 — End: 2015-02-21

## 2014-10-18 NOTE — Patient Instructions (Addendum)
Happy belated birthday!!     Please send me a message via mychart with the amt of vitamin d your taking    Walk every day and extra 30-40 minutes   Vaccine Information Statement    Pneumococcal Polysaccharide Vaccine: What You Need to Know    Many Vaccine Information Statements are available in Spanish and other languages. See PromoAge.com.brwww.immunize.org/vis.  Hojas de informaci??n Sobre Vacunas est??n disponibles en espa??ol y en muchos otros idiomas. Visite PurchaseFilters.athttp://www.immunize.org/vis.    1. Why get vaccinated?    Vaccination can protect older adults (and some children and younger adults) from pneumococcal disease.    Pneumococcal disease is caused by bacteria that can spread from person to person through close contact.  It can cause ear infections, and it can also lead to more serious infections of the:  ??? Lungs (pneumonia),  ??? Blood (bacteremia), and  ??? Covering of the brain and spinal cord (meningitis). Meningitis can cause deafness and brain damage, and it can be fatal.      Anyone can get pneumococcal disease, but children under 642 years of age, people with certain medical conditions, adults over 48 years of age, and cigarette smokers are at the highest risk.    About 18,000 older adults die each year from pneumococcal disease in the [place].    Treatment of pneumococcal infections with penicillin and other drugs used to be more effective. But some strains of the disease have become resistant to these drugs. This makes prevention of the disease, through vaccination, even more important.    2. Pneumococcal polysaccharide vaccine (PPSV23)    Pneumococcal polysaccharide vaccine (PPSV23) protects against 23 types of pneumococcal bacteria. It will not prevent all pneumococcal disease.    PPSV23 is recommended for:  ??? All adults 48 years of age and older,  ??? Anyone 2 through 48 years of age with certain long-term health problems,  ??? Anyone 2 through 48 years of age with a weakened immune system,   ??? Adults 119 through 48 years of age who smoke cigarettes or have asthma.     Most people need only one dose of PPSV.  A second dose is recommended for certain high-risk groups.  People 2265 and older should get a dose even if they have gotten one or more doses of the vaccine before they turned 65.    Your healthcare provider can give you more information about these recommendations.    Most healthy adults develop protection within 2 to 3 weeks of getting the shot.     3. Some people should not get this vaccine    ??? Anyone who has had a life-threatening allergic reaction to PPSV should not get another dose.    ??? Anyone who has a severe allergy to any component of PPSV should not receive it. Tell your provider if you have any severe allergies.    ??? Anyone who is moderately or severely ill when the shot is scheduled may be asked to wait until they recover before getting the vaccine. Someone with a mild illness can usually be vaccinated.    ??? Children less than 272 years of age should not receive this vaccine.    ??? There is no evidence that PPSV is harmful to either a pregnant woman or to her fetus. However, as a precaution, women who need the vaccine should be vaccinated before becoming pregnant, if possible.    4. Risks of a vaccine reaction    With any medicine, including vaccines,  there is a chance of side effects. These are usually mild and go away on their own, but serious reactions are also possible.     About half of people who get PPSV have mild side effects, such as redness or pain where the shot is given, which go away within about two days.    Less than 1 out of 100 people develop a fever, muscle aches, or more severe local reactions.    Problems that could happen after any vaccine:    ??? People sometimes faint after a medical procedure, including vaccination. Sitting or lying down for about 15 minutes can help prevent fainting, and injuries caused by a fall. Tell your doctor if you feel dizzy, or have  vision changes or ringing in the ears.    ??? Some people get severe pain in the shoulder and have difficulty moving the arm where a shot was given. This happens very rarely.    ??? Any medication can cause a severe allergic reaction. Such reactions from a vaccine are very rare, estimated at about 1 in a million doses, and would happen within a few minutes to a few hours after the vaccination.     As with any medicine, there is a very remote chance of a vaccine causing a serious injury or death.    The safety of vaccines is always being monitored.  For more information, visit: http://floyd.org/     5. What if there is a serious reaction?    What should I look for?    Look for anything that concerns you, such as signs of a severe allergic reaction, very high fever, or unusual behavior.    Signs of a severe allergic reaction can include hives, swelling of the face and throat, difficulty breathing, a fast heartbeat, dizziness, and weakness. These would usually start a few minutes to a few hours after the vaccination.    What should I do?    If you think it is a severe allergic reaction or other emergency that can???t wait, call 9-1-1 or get to the nearest hospital. Otherwise, call your doctor.    Afterward, the reaction should be reported to the Vaccine Adverse Event Reporting System (VAERS). Your doctor might file this report, or you can do it yourself through the VAERS web site at www.vaers.LAgents.no, or by calling 1-(657)886-3530.     VAERS does not give medical advice.    6. How can I learn more?    ??? Ask your doctor. He or she can give you the vaccine package insert or suggest other sources of information.  ??? Call your local or state health department.  ??? Contact the Centers for Disease Control and Prevention (CDC):  - Call 986-401-4256 (1-800-CDC-INFO) or  - Visit CDC???s website at PicCapture.uy    Vaccine Information Statement   PPSV   04/23/2014    Department of Health and Psychologist, educational for Disease Control and Prevention    Office Use Only

## 2014-10-18 NOTE — Progress Notes (Signed)
Chief Complaint   Patient presents with   ??? Results   ??? Medication Refill       Subjective:   Jonathan Watts 48 y.o.  male with a  past medical history reviewed see below. comes in today for results using otc vit d 500 or 1000unit tab  does not remember taking rx for vit D   bs are all under 150 ppbs   He has not been taking his ace as he has been out a week and thought he would just get it refilled today denies any cp sob n/v/d/c abd pain no ha's no other concerns     ROS: otherwise feeling generally well. All other systems reviewed and are negative      Current Outpatient Prescriptions   Medication Sig Dispense Refill   ??? amLODIPine (NORVASC) 5 mg tablet Take 1 Tab by mouth daily. 30 Tab 3   ??? metoprolol succinate (TOPROL-XL) 25 mg XL tablet Take 1 Tab by mouth daily. 30 Tab 3   ??? lisinopril (PRINIVIL, ZESTRIL) 20 mg tablet Take 1 Tab by mouth daily. 30 Tab 3   ??? atorvastatin (LIPITOR) 10 mg tablet Take 1 Tab by mouth daily. 30 Tab 3   ??? empagliflozin (JARDIANCE) 25 mg tablet Take 1 Tab by mouth daily. 30 Tab 3   ??? glucose blood VI test strips (ASCENSIA CONTOUR) strip Use daily as directed 1 Each 11   ??? ergocalciferol (ERGOCALCIFEROL) 50,000 unit capsule Take 1 Cap by mouth every seven (7) days. For twelve weeks once a week 12 Cap 0   ??? metFORMIN (GLUCOPHAGE) 500 mg tablet Take 1 Tab by mouth daily (with dinner). Use if your blood sugar is > 150 before dinner or take when you consume ETOH 30 Tab 0   ??? Blood-Glucose Meter (CONTOUR METER) monitoring kit Dx NIDDM 1 Kit 0     Allergies   Allergen Reactions   ??? Hay Fever & Allergy Relief Sneezing     Past Medical History   Diagnosis Date   ??? Hypertension    ??? Diabetes ([place])      No past surgical history on file.  Family History   Problem Relation Age of Onset   ??? Diabetes Mother    ??? Heart Disease Father      History   Substance Use Topics   ??? Smoking status: Never Smoker    ??? Smokeless tobacco: Never Used   ??? Alcohol Use: Yes          Objective:    BP 144/98 mmHg   Pulse 60   Temp(Src) 98 ??F (36.7 ??C) (Oral)   Resp 14   Ht '6\' 1"'  (1.854 m)   Wt 209 lb (94.802 kg)   BMI 27.58 kg/m2   SpO2 98%  Gen: NAD, pleasant  HEENT: normal appearing head, nares patent, PERRLA, EOMI, oropharynx no erythema, no cervical lymphadenopathy neck supple   Cardio: RRR nl S1S2 no murmur  Lungs CTAB no wheeze no rales no rhonchi  ABD Soft non tender non distended + bowel sounds  Extremities: full ROM X 4 no clubbing no cyanosis  Neuro: no gross focal deficits noted, alert and orientated X 3  Psych.: well groomed no outward signs of depression.      Assessment/Plan:   Jonathan Watts was seen today for results and medication refill.    Diagnoses and associated orders for this visit:    Vitamin D deficiency    Non-insulin dependent type 2 diabetes mellitus ([place])  Encounter to discuss test results    Medical non-compliance    Encounter for immunization  - Pneumococcal conjugate 13 valent, IM (PREVNAR-13)    Other Orders  - lisinopril (PRINIVIL, ZESTRIL) 20 mg tablet; Take 1 Tab by mouth daily.  - glucose blood VI test strips (ASCENSIA CONTOUR) strip; Use daily as directed  - atorvastatin (LIPITOR) 10 mg tablet; Take 2 Tabs by mouth daily.  - amLODIPine (NORVASC) 5 mg tablet; Take 1 Tab by mouth daily.  - metoprolol succinate (TOPROL-XL) 25 mg XL tablet; Take 1 Tab by mouth daily.  - empagliflozin (JARDIANCE) 25 mg tablet; Take 1 Tab by mouth daily.  - metFORMIN (GLUCOPHAGE) 500 mg tablet; Take 1 Tab by mouth daily (with dinner). Use if your blood sugar is > 150 before dinner or take when you consume ETOH        Follow-up Disposition:  Return in about 3 months (around 01/18/2015) for nurse visit recheck bp and weight in and a 3 month am fasting appt for 15 min recheck htn/dm/chol ..  avs printed and given to the pt..  Did not change meds will try dietary and exercise changes first and recheck in 3 months and then adjsut meds did not change bp meds as has been out a week of ace    The patient voiced understanding of the above. Medication side effects were reviewed with the patient. Call with any concerns.

## 2014-11-22 ENCOUNTER — Encounter: Attending: Family Medicine | Primary: Family Medicine

## 2014-12-06 ENCOUNTER — Institutional Professional Consult (permissible substitution)
Admit: 2014-12-06 | Discharge: 2014-12-06 | Payer: PRIVATE HEALTH INSURANCE | Attending: Family Medicine | Primary: Family Medicine

## 2014-12-06 ENCOUNTER — Encounter: Attending: Family Medicine | Primary: Family Medicine

## 2014-12-06 DIAGNOSIS — E119 Type 2 diabetes mellitus without complications: Secondary | ICD-10-CM

## 2014-12-06 MED ORDER — EMPAGLIFLOZIN 25 MG TABLET
25 mg | ORAL_TABLET | Freq: Every day | ORAL | Status: DC
Start: 2014-12-06 — End: 2015-02-21

## 2014-12-06 NOTE — Patient Instructions (Signed)
Please call your insurance company right away and find out if farixiga or invokana are being covered

## 2014-12-27 LAB — CULTURE, FUNGUS

## 2014-12-29 NOTE — Progress Notes (Signed)
Chief Complaint   Patient presents with   ??? Blood Pressure Check           Subjective:   Jonathan Watts 48 y.o.  male with a  past medical history reviewed see below. comes in today c/o: here for f/up of bp using bp meds as directed and using bs meds no home bp and here for a bp and a weight check no concerns

## 2015-02-21 ENCOUNTER — Ambulatory Visit
Admit: 2015-02-21 | Discharge: 2015-02-21 | Payer: PRIVATE HEALTH INSURANCE | Attending: Family Medicine | Primary: Family Medicine

## 2015-02-21 DIAGNOSIS — E119 Type 2 diabetes mellitus without complications: Secondary | ICD-10-CM

## 2015-02-21 MED ORDER — METOPROLOL SUCCINATE SR 25 MG 24 HR TAB
25 mg | ORAL_TABLET | Freq: Every day | ORAL | Status: DC
Start: 2015-02-21 — End: 2015-05-09

## 2015-02-21 MED ORDER — LISINOPRIL 20 MG TAB
20 mg | ORAL_TABLET | Freq: Every day | ORAL | Status: DC
Start: 2015-02-21 — End: 2015-05-09

## 2015-02-21 MED ORDER — ATORVASTATIN 10 MG TAB
10 mg | ORAL_TABLET | Freq: Every day | ORAL | Status: DC
Start: 2015-02-21 — End: 2015-05-09

## 2015-02-21 MED ORDER — EMPAGLIFLOZIN 25 MG TABLET
25 mg | ORAL_TABLET | Freq: Every day | ORAL | Status: DC
Start: 2015-02-21 — End: 2015-05-09

## 2015-02-21 MED ORDER — AMLODIPINE 5 MG TAB
5 mg | ORAL_TABLET | Freq: Every day | ORAL | Status: DC
Start: 2015-02-21 — End: 2015-05-09

## 2015-02-21 NOTE — Patient Instructions (Signed)
Call with any concerns.  MyChart Activation    Thank you for requesting access to MyChart. Please follow the instructions below to securely access and download your online medical record. MyChart allows you to send messages to your doctor, view your test results, renew your prescriptions, schedule appointments, and more.    How Do I Sign Up?    1. In your internet browser, go to [email or web address]  2. Click on the First Time User? Click Here link in the Sign In box. You will be redirect to the New Member Sign Up page.  3. Enter your MyChart Access Code exactly as it appears below. You will not need to use this code after you???ve completed the sign-up process. If you do not sign up before the expiration date, you must request a new code.    MyChart Access Code: Activation code not generated  Current MyChart Status: Active (This is the date your MyChart access code will expire)    4. Enter the last four digits of your Social Security Number (xxxx) and Date of Birth (mm/dd/yyyy) as indicated and click Submit. You will be taken to the next sign-up page.  5. Create a MyChart ID. This will be your MyChart login ID and cannot be changed, so think of one that is secure and easy to remember.  6. Create a MyChart password. You can change your password at any time.  7. Enter your Password Reset Question and Answer. This can be used at a later time if you forget your password.   8. Enter your e-mail address. You will receive e-mail notification when new information is available in MyChart.  9. Click Sign Up. You can now view and download portions of your medical record.  10. Click the Download Summary menu link to download a portable copy of your medical information.    Additional Information    If you have questions, please visit the Frequently Asked Questions section of the MyChart website at [email or web address] Remember, MyChart is NOT to be used for urgent needs. For medical emergencies, dial 911.

## 2015-02-21 NOTE — Progress Notes (Signed)
Chief Complaint   Patient presents with   ??? Hypertension   ??? Diabetes   ??? Cholesterol Problem     pt has eaten breakfast   ??? Medication Refill           Subjective:   Jonathan Watts 49 y.o.  male with a  past medical history reviewed see below. comes in today c/o: back injuryat work and has not been able to work out. 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 And he is going to PT. He has been trying to work on his diet and his bs are "ok" 135.   He did take his bp medications this am. He had a sandwich sausage and egg !he is  Not having any other concerns     ROS: otherwise feeling generally well. All other systems reviewed and are negative    Current Outpatient Prescriptions   Medication Sig Dispense Refill   ??? empagliflozin (JARDIANCE) 25 mg tablet Take 1 Tab by mouth daily. 30 Tab 3   ??? lisinopril (PRINIVIL, ZESTRIL) 20 mg tablet Take 1 Tab by mouth daily. 30 Tab 3   ??? glucose blood VI test strips (ASCENSIA CONTOUR) strip Use daily as directed 100 Strip 11   ??? atorvastatin (LIPITOR) 10 mg tablet Take 2 Tabs by mouth daily. 30 Tab 3   ??? amLODIPine (NORVASC) 5 mg tablet Take 1 Tab by mouth daily. 30 Tab 3   ??? metoprolol succinate (TOPROL-XL) 25 mg XL tablet Take 1 Tab by mouth daily. 30 Tab 3   ??? ergocalciferol (ERGOCALCIFEROL) 50,000 unit capsule Take 1 Cap by mouth every seven (7) days. For twelve weeks once a week 12 Cap 0   ??? Blood-Glucose Meter (CONTOUR METER) monitoring kit Dx NIDDM 1 Kit 0   ??? metFORMIN (GLUCOPHAGE) 500 mg tablet Take 1 Tab by mouth daily (with dinner). Use if your blood sugar is > 150 before dinner or take when you consume ETOH 30 Tab 3     Allergies   Allergen Reactions   ??? Hay Fever & Allergy Relief Sneezing     Past Medical History   Diagnosis Date   ??? Hypertension    ??? Diabetes ([place])      No past surgical history on file.  Family History   Problem Relation Age of Onset   ??? Diabetes Mother    ??? Heart Disease Father      History   Substance Use Topics    ??? Smoking status: Never Smoker    ??? Smokeless tobacco: Never Used   ??? Alcohol Use: Yes          Objective:   BP 153/95 mmHg   Pulse 60   Temp(Src) 96.3 ??F (35.7 ??C) (Oral)   Resp 18   Ht 6' 1" (1.854 m)   Wt 215 lb 3.2 oz (97.614 kg)   BMI 28.40 kg/m2   SpO2 94%  Gen: NAD, pleasant  HEENT: normal appearing head, nares patent, PERRLA, EOMI, oropharynx no erythema, no cervical lymphadenopathy neck supple   Cardio: RRR nl S1S2 no murmur  Lungs CTAB no wheeze no rales no rhonchi  ABD Soft non tender non distended + bowel sounds  Extremities: full ROM X 4 no clubbing no cyanosis mild paraspinal tenderness of left right rom full and pain resolved from previous exam   Neuro: no gross focal deficits noted, alert and orientated X 3  Psych.: well groomed no outward signs of depression.  Assessment/Plan:   Diron was seen today for hypertension, diabetes, cholesterol problem and medication refill.    Diagnoses and all orders for this visit:    Non-insulin dependent type 2 diabetes mellitus ([place])  Orders:  -     HEMOGLOBIN A1C    Benign essential HTN    Dyslipidemia    Work related injury    Vitamin D deficiency  Orders:  -     VITAMIN D, 25 HYDROXY    Elevated CK  Orders:  -     CK  -     METABOLIC PANEL, BASIC    Chronic back pain  Orders:  -     PSA W/ REFLX FREE PSA    Other orders  -     lisinopril (PRINIVIL, ZESTRIL) 20 mg tablet; Take 1 Tab by mouth daily.  -     atorvastatin (LIPITOR) 10 mg tablet; Take 2 Tabs by mouth daily.  -     amLODIPine (NORVASC) 5 mg tablet; Take 1 Tab by mouth daily.  -     metoprolol succinate (TOPROL-XL) 25 mg XL tablet; Take 1 Tab by mouth daily.  -     empagliflozin (JARDIANCE) 25 mg tablet; Take 1 Tab by mouth daily.      Follow-up Disposition:  Return in about 1 week (around 02/28/2015) for poc lipid and review labs and recheck bp 15 mintues ..  avs printed and given to the pt..    The patient voiced understanding of the above. Medication side effects  were reviewed with the patient. Call with any concerns.

## 2015-02-22 LAB — METABOLIC PANEL, BASIC
BUN/Creatinine ratio: 13 (ref 9–20)
BUN: 15 mg/dL (ref 6–24)
CO2: 22 mmol/L (ref 18–29)
Calcium: 9.2 mg/dL (ref 8.7–10.2)
Chloride: 101 mmol/L (ref 97–108)
Creatinine: 1.17 mg/dL (ref 0.76–1.27)
GFR est AA: 85 mL/min/{1.73_m2} (ref >59)
GFR est non-AA: 73 mL/min/{1.73_m2} (ref >59)
Glucose: 210 mg/dL — ABNORMAL HIGH (ref 65–99)
Potassium: 4.3 mmol/L (ref 3.5–5.2)
Sodium: 140 mmol/L (ref 134–144)

## 2015-02-22 LAB — PSA W/ REFLX FREE PSA: Prostate Specific Ag: 0.7 ng/mL (ref 0.0–4.0)

## 2015-02-22 LAB — HEMOGLOBIN A1C WITH EAG: Hemoglobin A1c: 9.8 % — ABNORMAL HIGH (ref 4.8–5.6)

## 2015-02-22 LAB — CK: Creatine Kinase,Total: 237 U/L — ABNORMAL HIGH (ref 24–204)

## 2015-02-22 LAB — VITAMIN D, 25 HYDROXY: VITAMIN D, 25-HYDROXY: 12 ng/mL — ABNORMAL LOW (ref 30.0–100.0)

## 2015-02-22 NOTE — Progress Notes (Signed)
Quick Note:        Will review on the 7th diabetes not well controlled vit d too low!    ______

## 2015-03-07 ENCOUNTER — Ambulatory Visit
Admit: 2015-03-07 | Discharge: 2015-03-07 | Payer: PRIVATE HEALTH INSURANCE | Attending: Family Medicine | Primary: Family Medicine

## 2015-03-07 ENCOUNTER — Other Ambulatory Visit
Admit: 2015-03-07 | Discharge: 2015-03-07 | Payer: PRIVATE HEALTH INSURANCE | Attending: Family Medicine | Primary: Family Medicine

## 2015-03-07 DIAGNOSIS — E119 Type 2 diabetes mellitus without complications: Secondary | ICD-10-CM

## 2015-03-07 MED ORDER — BLOOD SUGAR DIAGNOSTIC TEST STRIPS
ORAL_STRIP | Status: DC
Start: 2015-03-07 — End: 2016-08-07

## 2015-03-07 MED ORDER — ERGOCALCIFEROL (VITAMIN D2) 50,000 UNIT CAP
1250 mcg (50,000 unit) | ORAL_CAPSULE | ORAL | Status: AC
Start: 2015-03-07 — End: ?

## 2015-03-07 MED ORDER — KETOCONAZOLE 200 MG TAB
200 mg | ORAL_TABLET | Freq: Every day | ORAL | Status: AC
Start: 2015-03-07 — End: 2015-04-06

## 2015-03-07 NOTE — Patient Instructions (Signed)
Check bs in the am and 2 hrs after  (do not test 4 times a day) eating bring in a blood sugar log to the next appt.      fasting blood sugar first thing in the am __________   __________  ___________    2 hrs after break fast _______     ___________    ___________    2 hrs after lunch _______  ________  __________    2 hrs after dinner ________  _________ _________    Random one around 10 pm  _________ ___________    _______      Your hga1c is too high please monitor your blood sugars. Fasting should be around 120 and 2 hrs after eating around 140

## 2015-03-07 NOTE — Progress Notes (Signed)
Chief Complaint   Patient presents with   ??? Results           Subjective:   Jonathan Watts 49 y.o.  male with a  past medical history reviewed see below. comes in today here for labs and to recheck bp no home bp's to review taking meds as directed     ROS: otherwise feeling generally well. All other systems reviewed and are negative      Current Outpatient Prescriptions   Medication Sig Dispense Refill   ??? lisinopril (PRINIVIL, ZESTRIL) 20 mg tablet Take 1 Tab by mouth daily. 30 Tab 3   ??? atorvastatin (LIPITOR) 10 mg tablet Take 2 Tabs by mouth daily. 30 Tab 3   ??? amLODIPine (NORVASC) 5 mg tablet Take 1 Tab by mouth daily. 30 Tab 3   ??? metoprolol succinate (TOPROL-XL) 25 mg XL tablet Take 1 Tab by mouth daily. 30 Tab 3   ??? empagliflozin (JARDIANCE) 25 mg tablet Take 1 Tab by mouth daily. 30 Tab 3   ??? glucose blood VI test strips (ASCENSIA CONTOUR) strip Use daily as directed 100 Strip 11   ??? metFORMIN (GLUCOPHAGE) 500 mg tablet Take 1 Tab by mouth daily (with dinner). Use if your blood sugar is > 150 before dinner or take when you consume ETOH 30 Tab 3   ??? ergocalciferol (ERGOCALCIFEROL) 50,000 unit capsule Take 1 Cap by mouth every seven (7) days. For twelve weeks once a week 12 Cap 0   ??? Blood-Glucose Meter (CONTOUR METER) monitoring kit Dx NIDDM 1 Kit 0     Allergies   Allergen Reactions   ??? Hay Fever & Allergy Relief Sneezing     Past Medical History   Diagnosis Date   ??? Hypertension    ??? Diabetes ([place])      No past surgical history on file.  Family History   Problem Relation Age of Onset   ??? Diabetes Mother    ??? Heart Disease Father      History   Substance Use Topics   ??? Smoking status: Never Smoker    ??? Smokeless tobacco: Never Used   ??? Alcohol Use: Yes          Objective:   BP 133/82 mmHg   Pulse 60   Temp(Src) 97 ??F (36.1 ??C) (Oral)   Resp 14   Ht '6\' 1"'  (1.854 m)   Wt 211 lb (95.709 kg)   BMI 27.84 kg/m2   SpO2 98%  Gen: NAD, pleasant   HEENT: normal appearing head, nares patent, PERRLA, EOMI, oropharynx no erythema, no cervical lymphadenopathy neck supple   Cardio: RRR nl S1S2 no murmur  Lungs CTAB no wheeze no rales no rhonchi  ABD Soft non tender non distended + bowel sounds  Extremities: full ROM X 4 no clubbing no cyanosis  Neuro: no gross focal deficits noted, alert and orientated X 3  Psych.: well groomed no outward signs of depression.  Feet toenail fungus noted slight improvement     Assessment/Plan:   [name] was seen today for results.    Diagnoses and all orders for this visit:    Non-insulin dependent type 2 diabetes mellitus ([place])  Orders:  -     MICROALBUMIN:CREATININE RATIO, RANDOM URINE    BP check  Orders:  -     MICROALBUMIN:CREATININE RATIO, RANDOM URINE    Encounter to discuss test results    Left-sided back pain, unspecified location    Fungal toenail infection  Other orders  -     ketoconazole (NIZORAL) 200 mg tablet; Take 1 Tab by mouth daily for 30 days.  -     ergocalciferol (ERGOCALCIFEROL) 50,000 unit capsule; Take 1 Cap by mouth every seven (7) days. For twelve weeks once a week  -     glucose blood VI test strips (ASCENSIA CONTOUR) strip; Use daily as directed      Follow-up Disposition:  Return in about 1 month (around 04/07/2015), or lfts recheck nail and review bs log 15 mintues.Marland Kitchen  avs printed and given to the pt..    The patient voiced understanding of the above. Medication side effects were reviewed with the patient. Call with any concerns.

## 2015-03-08 LAB — MICROALBUMIN:CREATININE RATIO, RANDOM URINE
Creatinine, urine random: 69 mg/dL (ref 22.0–328.0)
Microalb/Creat ratio (ug/mg creat.): <4.3 mg/g creat (ref 0.0–30.0)
Microalbumin, urine: <3 ug/mL (ref 0.0–17.0)

## 2015-03-20 NOTE — Progress Notes (Signed)
Lab only

## 2015-04-11 ENCOUNTER — Encounter: Attending: Family Medicine | Primary: Family Medicine

## 2015-05-09 ENCOUNTER — Ambulatory Visit
Admit: 2015-05-09 | Discharge: 2015-05-09 | Payer: PRIVATE HEALTH INSURANCE | Attending: Family Medicine | Primary: Family Medicine

## 2015-05-09 DIAGNOSIS — R6882 Decreased libido: Secondary | ICD-10-CM

## 2015-05-09 MED ORDER — AMLODIPINE 10 MG TAB
10 mg | ORAL_TABLET | Freq: Every day | ORAL | Status: AC
Start: 2015-05-09 — End: ?

## 2015-05-09 MED ORDER — ATORVASTATIN 10 MG TAB
10 mg | ORAL_TABLET | Freq: Every day | ORAL | Status: AC
Start: 2015-05-09 — End: ?

## 2015-05-09 MED ORDER — LISINOPRIL 20 MG TAB
20 mg | ORAL_TABLET | Freq: Every day | ORAL | Status: DC
Start: 2015-05-09 — End: 2015-05-16

## 2015-05-09 MED ORDER — METOPROLOL SUCCINATE SR 25 MG 24 HR TAB
25 mg | ORAL_TABLET | Freq: Every day | ORAL | Status: AC
Start: 2015-05-09 — End: ?

## 2015-05-09 MED ORDER — EMPAGLIFLOZIN 25 MG TABLET
25 mg | ORAL_TABLET | Freq: Every day | ORAL | Status: AC
Start: 2015-05-09 — End: ?

## 2015-05-09 NOTE — Progress Notes (Signed)
Chief Complaint   Patient presents with   ??? Hypertension   ??? Diabetes   ??? Medication Refill           Subjective:   Jonathan Watts 49 y.o.  male with a  past medical history reviewed see below. comes in Here for a bp check and to go over meds he has taken all medications and he notes his bp has been going up for tha past week and a half he has some mild urinary frequency but this is not new and started when he used the jardiance he has finished the keotconazole and his toe is better. He will be moving to [place] next month. He has changed to a new job and is less stressful he notes his sex drive is down as well as he feels tired and does not want to do anything. 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 An diet is low in na+   his toes fungus has improved as well     ROS: otherwise feeling generally well. All other systems reviewed and are negative      Current Outpatient Prescriptions   Medication Sig Dispense Refill   ??? glucose blood VI test strips (ASCENSIA CONTOUR) strip Use daily as directed 100 Strip 11   ??? lisinopril (PRINIVIL, ZESTRIL) 20 mg tablet Take 1 Tab by mouth daily. 30 Tab 3   ??? atorvastatin (LIPITOR) 10 mg tablet Take 2 Tabs by mouth daily. 30 Tab 3   ??? amLODIPine (NORVASC) 5 mg tablet Take 1 Tab by mouth daily. 30 Tab 3   ??? metoprolol succinate (TOPROL-XL) 25 mg XL tablet Take 1 Tab by mouth daily. 30 Tab 3   ??? empagliflozin (JARDIANCE) 25 mg tablet Take 1 Tab by mouth daily. 30 Tab 3   ??? Blood-Glucose Meter (CONTOUR METER) monitoring kit Dx NIDDM 1 Kit 0   ??? ergocalciferol (ERGOCALCIFEROL) 50,000 unit capsule Take 1 Cap by mouth every seven (7) days. For twelve weeks once a week 12 Cap 0   ??? metFORMIN (GLUCOPHAGE) 500 mg tablet Take 1 Tab by mouth daily (with  dinner). Use if your blood sugar is > 150 before dinner or take when you consume ETOH 30 Tab 3     Allergies   Allergen Reactions   ??? Hay Fever & Allergy Relief Sneezing     Past Medical History   Diagnosis Date   ??? Hypertension    ??? Diabetes ([place])      No past surgical history on file.  Family History   Problem Relation Age of Onset   ??? Diabetes Mother    ??? Heart Disease Father      History   Substance Use Topics   ??? Smoking status: Never Smoker    ??? Smokeless tobacco: Never Used   ??? Alcohol Use: Yes          Objective:   BP 166/112 mmHg   Pulse 54   Temp(Src) 95.7 ??F (35.4 ??C) (Oral)   Resp 20   Ht '6\' 1"'  (1.854 m)   Wt 217 lb 9.6 oz (98.703 kg)   BMI 28.72 kg/m2   SpO2 98%  Gen: NAD, pleasant  HEENT: normal appearing head, nares patent, PERRLA, EOMI, oropharynx  no erythema, no cervical lymphadenopathy neck supple   Cardio: RRR nl S1S2 no murmur  Lungs CTAB no wheeze no rales no rhonchi  ABD Soft non tender non distended + bowel sounds  Extremities: full ROM X 4 no clubbing no cyanosis  Neuro: no gross focal deficits noted, alert and orientated X 3  Psych.: well groomed no outward signs of depression.  Toes fungus resolved/improved right foot     Assessment/Plan:   Jonathan Watts was seen today for hypertension, diabetes and medication refill.    Diagnoses and all orders for this visit:    Decreased libido without sexual dysfunction  Orders:  -     TSH 3RD GENERATION    Uncontrolled hypertension  Orders:  -     METABOLIC PANEL, BASIC  -     UA/M W/RFLX CULTURE, COMP  -     TSH 3RD GENERATION    Elevated CK  Orders:  -     CK    Other orders  -     lisinopril (PRINIVIL, ZESTRIL) 20 mg tablet; Take 1 Tab by mouth daily.  -     atorvastatin (LIPITOR) 10 mg tablet; Take 2 Tabs by mouth daily.  -     amLODIPine (NORVASC) 10 mg tablet; Take 1 Tab by mouth daily.  -     metoprolol succinate (TOPROL-XL) 25 mg XL tablet; Take 1 Tab by mouth daily.  -     empagliflozin (JARDIANCE) 25 mg tablet; Take 1 Tab by mouth daily.       Follow-up Disposition:  Return in about 1 week (around 05/16/2015) for recheck bp and go over labs 8:30 double pt please ..  avs printed and given to the pt..  Increased norvasc pt to bring in all medications to the next appt   The patient voiced understanding of the above. Medication side effects were reviewed with the patient. Call with any concerns.

## 2015-05-09 NOTE — Patient Instructions (Addendum)
congratulations on the new job!   Continue to monitor your blood pressure     Warning signs of Stroke:        * Sudden numbness or weakness of the face, arm or leg, especially on one side of          The body            * Sudden confusion, trouble speaking or understanding        * Sudden trouble seeing in one or both eyes        * Sudden trouble walking, dizziness, loss of balance or coordination        * Sudden severe headache with no known cause    It is important if these symptoms occur you call 911 immediately.    You can help lower your stroke risk by taking your blood pressure medications as directed daily, exercising, and limit salt intake in your diet.

## 2015-05-10 LAB — METABOLIC PANEL, BASIC
BUN/Creatinine ratio: 11 (ref 9–20)
BUN: 12 mg/dL (ref 6–24)
CO2: 24 mmol/L (ref 18–29)
Calcium: 8.9 mg/dL (ref 8.7–10.2)
Chloride: 102 mmol/L (ref 97–108)
Creatinine: 1.08 mg/dL (ref 0.76–1.27)
GFR est AA: 93 mL/min/{1.73_m2} (ref >59)
GFR est non-AA: 81 mL/min/{1.73_m2} (ref >59)
Glucose: 202 mg/dL — ABNORMAL HIGH (ref 65–99)
Potassium: 4.2 mmol/L (ref 3.5–5.2)
Sodium: 140 mmol/L (ref 134–144)

## 2015-05-10 LAB — MICROSCOPIC EXAMINATION
Bacteria: NONE SEEN
Casts: NONE SEEN /lpf
Epithelial cells: NONE SEEN /hpf (ref 0–10)

## 2015-05-10 LAB — CK: Creatine Kinase,Total: 186 U/L (ref 24–204)

## 2015-05-10 LAB — UA/M W/RFLX CULTURE, COMP
Bilirubin: NEGATIVE
Blood: NEGATIVE
Ketone: NEGATIVE
Leukocyte Esterase: NEGATIVE
Nitrites: NEGATIVE
Protein: NEGATIVE
Specific Gravity: 1.03 (ref 1.005–1.030)
Urobilinogen: 0.2 mg/dL (ref 0.2–1.0)
pH (UA): 5.5 (ref 5.0–7.5)

## 2015-05-10 LAB — TSH 3RD GENERATION: TSH: 2.23 u[IU]/mL (ref 0.450–4.500)

## 2015-05-16 ENCOUNTER — Ambulatory Visit
Admit: 2015-05-16 | Discharge: 2015-05-16 | Payer: PRIVATE HEALTH INSURANCE | Attending: Family Medicine | Primary: Family Medicine

## 2015-05-16 DIAGNOSIS — I10 Essential (primary) hypertension: Secondary | ICD-10-CM

## 2015-05-16 MED ORDER — LISINOPRIL 20 MG TAB
20 mg | ORAL_TABLET | Freq: Two times a day (BID) | ORAL | Status: DC
Start: 2015-05-16 — End: 2015-11-25

## 2015-05-16 NOTE — Patient Instructions (Addendum)
Goal is for 1500mg  of sodium a day!! \\  Come in fasting to the next appt. Take your blood pressure medication with a full glass of water   8-10 glasses of water a daily.

## 2015-05-16 NOTE — Progress Notes (Signed)
Chief Complaint   Patient presents with   ??? Blood Pressure Check   ??? Labs       Subjective:   Jonathan Watts 49 y.o.  male with a  past medical history reviewed see below. comes in today for f/up of BP   Home bp was 150's/90's and he is using his bp medication as directed.  He has been eating processed foods  He has been exercising.  drinkging lots of diet water sweeteners.      ROS: left calf had a charlie horse and it resolved after  Minute it occurred after exercising and occurred after stretching   otherwise feeling generally well. All other systems reviewed and are negative      Current Outpatient Prescriptions   Medication Sig Dispense Refill   ??? lisinopril (PRINIVIL, ZESTRIL) 20 mg tablet Take 1 Tab by mouth two (2) times a day. 60 Tab 3   ??? atorvastatin (LIPITOR) 10 mg tablet Take 2 Tabs by mouth daily. 30 Tab 3   ??? amLODIPine (NORVASC) 10 mg tablet Take 1 Tab by mouth daily. 30 Tab 3   ??? metoprolol succinate (TOPROL-XL) 25 mg XL tablet Take 1 Tab by mouth daily. 30 Tab 3   ??? empagliflozin (JARDIANCE) 25 mg tablet Take 1 Tab by mouth daily. 30 Tab 3   ??? ergocalciferol (ERGOCALCIFEROL) 50,000 unit capsule Take 1 Cap by mouth every seven (7) days. For twelve weeks once a week 12 Cap 0   ??? glucose blood VI test strips (ASCENSIA CONTOUR) strip Use daily as directed 100 Strip 11   ??? metFORMIN (GLUCOPHAGE) 500 mg tablet Take 1 Tab by mouth daily (with dinner). Use if your blood sugar is > 150 before dinner or take when you consume ETOH 30 Tab 3   ??? Blood-Glucose Meter (CONTOUR METER) monitoring kit Dx NIDDM 1 Kit 0     Allergies   Allergen Reactions   ??? Hay Fever & Allergy Relief Sneezing     Past Medical History   Diagnosis Date   ??? Hypertension    ??? Diabetes ([place])      No past surgical history on file.  Family History   Problem Relation Age of Onset   ??? Diabetes Mother    ??? Heart Disease Father      History   Substance Use Topics   ??? Smoking status: Never Smoker    ??? Smokeless tobacco: Never Used    ??? Alcohol Use: Yes          Objective:   BP 151/94 mmHg   Pulse 54   Temp(Src) 96.2 ??F (35.7 ??C) (Oral)   Resp 20   Ht '6\' 1"'  (1.854 m)   Wt 214 lb 1.6 oz (97.115 kg)   BMI 28.25 kg/m2   SpO2 97%  Gen: NAD, pleasant  HEENT: normal appearing head, nares patent, PERRLA, EOMI, oropharynx no erythema, no cervical lymphadenopathy neck supple   Cardio: RRR nl S1S2 no murmur  Lungs CTAB no wheeze no rales no rhonchi  ABD Soft non tender non distended + bowel sounds  Extremities: full ROM X 4 no clubbing no cyanosis  Neuro: no gross focal deficits noted, alert and orientated X 3  Psych.: well groomed no outward signs of depression.      Assessment/Plan:   Jonathan Watts was seen today for blood pressure check and labs.    Diagnoses and all orders for this visit:    Benign essential HTN    Dietary counseling  Encounter to discuss test results    Other orders  -     lisinopril (PRINIVIL, ZESTRIL) 20 mg tablet; Take 1 Tab by mouth two (2) times a day.      Follow-up Disposition:  Return in about 11 days (around 05/27/2015) for 11:15 diabetic education ok to double book 30 minutes ..  avs printed and given to the pt..    The patient voiced understanding of the above. Medication side effects were reviewed with the patient. Call with any concerns.

## 2015-05-27 ENCOUNTER — Ambulatory Visit
Admit: 2015-05-27 | Discharge: 2015-05-27 | Payer: PRIVATE HEALTH INSURANCE | Attending: Family Medicine | Primary: Family Medicine

## 2015-05-27 DIAGNOSIS — E119 Type 2 diabetes mellitus without complications: Secondary | ICD-10-CM

## 2015-05-27 LAB — AMB POC GLUCOSE BLOOD, BY GLUCOSE MONITORING DEVICE: Glucose POC: 170 mg/dL

## 2015-05-27 NOTE — Patient Instructions (Addendum)
Bring in a food diary for 3 days everything your eating.   Check bs in the am and 2 hrs after  (do not test 4 times a day) eating bring in a blood sugar log to the next appt.      fasting blood sugar first thing in the am __________   __________  ___________    2 hrs after break fast _______     ___________    ___________    2 hrs after lunch _______  ________  __________    2 hrs after dinner ________  _________ _________    Random one around 10 pm  _________ ___________    _______

## 2015-05-27 NOTE — Progress Notes (Signed)
Chief Complaint   Patient presents with   ??? Diabetes     education class           Subjective:   Jonathan Watts 49 y.o.  male with a  past medical history reviewed see below.   here for diabetic education.  He is wearing a brace for his left wrist as he is moving today andits on for precaution denies any pain   He has missed a few days of taking the lisinopril bid but he is taking at least once a day     ROS: otherwise feeling generally well. All other systems reviewed and are negative      Current Outpatient Prescriptions   Medication Sig Dispense Refill   ??? lisinopril (PRINIVIL, ZESTRIL) 20 mg tablet Take 1 Tab by mouth two (2) times a day. 60 Tab 3   ??? atorvastatin (LIPITOR) 10 mg tablet Take 2 Tabs by mouth daily. 30 Tab 3   ??? amLODIPine (NORVASC) 10 mg tablet Take 1 Tab by mouth daily. 30 Tab 3   ??? metoprolol succinate (TOPROL-XL) 25 mg XL tablet Take 1 Tab by mouth daily. 30 Tab 3   ??? empagliflozin (JARDIANCE) 25 mg tablet Take 1 Tab by mouth daily. 30 Tab 3   ??? ergocalciferol (ERGOCALCIFEROL) 50,000 unit capsule Take 1 Cap by mouth every seven (7) days. For twelve weeks once a week 12 Cap 0   ??? glucose blood VI test strips (ASCENSIA CONTOUR) strip Use daily as directed 100 Strip 11   ??? metFORMIN (GLUCOPHAGE) 500 mg tablet Take 1 Tab by mouth daily (with dinner). Use if your blood sugar is > 150 before dinner or take when you consume ETOH 30 Tab 3   ??? Blood-Glucose Meter (CONTOUR METER) monitoring kit Dx NIDDM 1 Kit 0     Allergies   Allergen Reactions   ??? Hay Fever & Allergy Relief Sneezing     Past Medical History   Diagnosis Date   ??? Hypertension    ??? Diabetes ([place])      No past surgical history on file.  Family History   Problem Relation Age of Onset   ??? Diabetes Mother    ??? Heart Disease Father      History   Substance Use Topics   ??? Smoking status: Never Smoker    ??? Smokeless tobacco: Never Used   ??? Alcohol Use: Yes          Objective:    BP 126/81 mmHg   Pulse 64   Temp(Src) 97.5 ??F (36.4 ??C) (Oral)   Resp 18   Ht $R'6\' 1"'tJ$  (1.854 m)   Wt 211 lb 12.8 oz (96.072 kg)   BMI 27.95 kg/m2   SpO2 96%  Gen: NAD, pleasant  HEENT: normal appearing head, nares patent, PERRLA, EOMI, oropharynx no erythema, no cervical lymphadenopathy neck supple   Cardio: RRR nl S1S2 no murmur  Lungs CTAB no wheeze no rales no rhonchi  ABD Soft non tender non distended + bowel sounds  Extremities: full ROM X 4 no clubbing no cyanosis  Neuro: no gross focal deficits noted, alert and orientated X 3  Psych.: well groomed no outward signs of depression.      Assessment/Plan:   Jonathan Watts was seen today for diabetes.    Diagnoses and all orders for this visit:    Non-insulin dependent type 2 diabetes mellitus ([place])  Orders:  -     AMB POC GLUCOSE BLOOD, BY GLUCOSE MONITORING DEVICE  -  Cancel: HEMOGLOBIN A1C WITH EAG  -     Cancel: LDL, DIRECT  -     HEMOGLOBIN A1C WITH EAG    Dyslipidemia  Orders:  -     Cancel: HEMOGLOBIN A1C WITH EAG  -     Cancel: LDL, DIRECT  -     LDL, DIRECT  -     HEMOGLOBIN A1C WITH EAG  -     NMR LIPOPROFILE    Benign essential HTN  Orders:  -     Cancel: HEMOGLOBIN A1C WITH EAG  -     Cancel: LDL, DIRECT    Other orders  -     CVD REPORT  -     DIABETES PATIENT EDUCATION      Follow-up Disposition:  Return in about 1 month (around 06/27/2015) for review labs and food diary 15 mintues ..  avs printed and given to the pt..  Diabetic education pt will focus on carbs,  Bring in a food diary for 3 days continue lisinopril bid bps now at goal!   The patient voiced understanding of the above. Medication side effects were reviewed with the patient. Call with any concerns.

## 2015-05-27 NOTE — Progress Notes (Signed)
Waist Circumference 38.5 inches

## 2015-05-31 LAB — NMR LIPOPROFILE WITH LIPIDS (WITHOUT GRAPH)
Cholesterol, Total: 169 mg/dL (ref 100–199)
HDL-C: 51 mg/dL (ref >39)
HDL-P (Total): 32.6 umol/L (ref >=30.5)
LDL size: 20.5 nm (ref >20.5)
LDL-C: 104 mg/dL — ABNORMAL HIGH (ref 0–99)
LDL-P: 1440 nmol/L — ABNORMAL HIGH (ref <1000)
LP-IR SCORE: 77 — ABNORMAL HIGH (ref <=45)
Small LDL-P: 730 nmol/L — ABNORMAL HIGH (ref <=527)
Triglycerides: 72 mg/dL (ref 0–149)

## 2015-05-31 LAB — LDL, DIRECT: LDL,Direct: 117 mg/dL — ABNORMAL HIGH (ref 0–99)

## 2015-05-31 LAB — HEMOGLOBIN A1C WITH EAG
Estimated average glucose: 220 mg/dL
Hemoglobin A1c: 9.3 % — ABNORMAL HIGH (ref 4.8–5.6)

## 2015-05-31 LAB — CVD REPORT

## 2015-06-24 ENCOUNTER — Encounter: Attending: Family Medicine | Primary: Family Medicine

## 2015-07-06 ENCOUNTER — Encounter: Attending: Family Medicine | Primary: Family Medicine

## 2015-07-28 NOTE — Telephone Encounter (Signed)
Please let pt know the rx they requested was sent to the pharmacy  And set up his f/up appt please

## 2015-07-29 MED ORDER — METFORMIN 500 MG TAB
500 mg | ORAL_TABLET | Freq: Every day | ORAL | Status: AC
Start: 2015-07-29 — End: ?

## 2015-09-28 ENCOUNTER — Encounter

## 2015-10-25 NOTE — Telephone Encounter (Signed)
Last office visit 05/27/2015

## 2015-10-26 MED ORDER — BLOOD GLUCOSE METER KIT
PACK | 0 refills | Status: AC
Start: 2015-10-26 — End: ?

## 2015-10-26 MED ORDER — BLOOD SUGAR DIAGNOSTIC TEST STRIPS
ORAL_STRIP | 12 refills | Status: AC
Start: 2015-10-26 — End: ?

## 2015-11-02 NOTE — Telephone Encounter (Signed)
Pharmacy need new prescriptions with directions

## 2015-11-20 NOTE — Telephone Encounter (Signed)
Call pt find out how he is taking his bs and then call in the directions to the pharmacy and update his chart

## 2015-12-06 MED ORDER — LISINOPRIL 20 MG TAB
20 mg | ORAL_TABLET | ORAL | 0 refills | Status: AC
Start: 2015-12-06 — End: ?

## 2015-12-06 NOTE — Telephone Encounter (Signed)
Needs an appt please schedule

## 2016-08-13 MED ORDER — CONTOUR TEST STRIPS
ORAL_STRIP | 47 refills | Status: AC
Start: 2016-08-13 — End: ?

## 2016-10-19 ENCOUNTER — Encounter: Payer: Self-pay | Admitting: Gastroenterology

## 2016-12-05 ENCOUNTER — Ambulatory Visit (AMBULATORY_SURGERY_CENTER): Payer: Self-pay | Admitting: *Deleted

## 2016-12-05 VITALS — Ht 72.75 in | Wt 211.0 lb

## 2016-12-05 DIAGNOSIS — Z1211 Encounter for screening for malignant neoplasm of colon: Secondary | ICD-10-CM

## 2016-12-05 MED ORDER — NA SULFATE-K SULFATE-MG SULF 17.5-3.13-1.6 GM/177ML PO SOLN: 1.0000 | Freq: Once | ORAL | 0 refills | Status: AC

## 2016-12-05 NOTE — Progress Notes (Signed)
No egg or soy allergy known to patient  No issues with past sedation with any surgeries  or procedures, no intubation problems  No diet pills per patient No home 02 use per patient  No blood thinners per patient  Pt denies issues with constipation  No A fib or A flutter   No e mail for emmi

## 2016-12-07 ENCOUNTER — Encounter: Payer: Self-pay | Admitting: Gastroenterology

## 2016-12-19 ENCOUNTER — Encounter: Payer: Self-pay | Admitting: Gastroenterology

## 2016-12-19 ENCOUNTER — Ambulatory Visit (AMBULATORY_SURGERY_CENTER): Payer: BLUE CROSS/BLUE SHIELD | Admitting: Gastroenterology

## 2016-12-19 VITALS — BP 102/69 | HR 56 | Temp 98.2°F | Resp 13 | Ht 72.0 in | Wt 211.0 lb

## 2016-12-19 DIAGNOSIS — D123 Benign neoplasm of transverse colon: Secondary | ICD-10-CM

## 2016-12-19 DIAGNOSIS — Z1212 Encounter for screening for malignant neoplasm of rectum: Secondary | ICD-10-CM

## 2016-12-19 DIAGNOSIS — Z1211 Encounter for screening for malignant neoplasm of colon: Secondary | ICD-10-CM | POA: Diagnosis not present

## 2016-12-19 DIAGNOSIS — K635 Polyp of colon: Secondary | ICD-10-CM | POA: Diagnosis not present

## 2016-12-19 MED ORDER — SODIUM CHLORIDE 0.9 % IV SOLN
500.0000 mL | INTRAVENOUS | Status: AC
Start: 2016-12-19 — End: ?

## 2016-12-19 NOTE — Patient Instructions (Signed)
YOU HAD AN ENDOSCOPIC PROCEDURE TODAY AT [facility]:   Refer to the procedure report that was given to you for any specific questions about what was found during the examination.  If the procedure report does not answer your questions, please call your gastroenterologist to clarify.  If you requested that your care partner not be given the details of your procedure findings, then the procedure report has been included in a sealed envelope for you to review at your convenience later.  YOU SHOULD EXPECT: Some feelings of bloating in the abdomen. Passage of more gas than usual.  Walking can help get rid of the air that was put into your GI tract during the procedure and reduce the bloating. If you had a lower endoscopy (such as a colonoscopy or flexible sigmoidoscopy) you may notice spotting of blood in your stool or on the toilet paper. If you underwent a bowel prep for your procedure, you may not have a normal bowel movement for a few days.  Please Note:  You might notice some irritation and congestion in your nose or some drainage.  This is from the oxygen used during your procedure.  There is no need for concern and it should clear up in a day or so.  SYMPTOMS TO REPORT IMMEDIATELY:   Following lower endoscopy (colonoscopy or flexible sigmoidoscopy):  Excessive amounts of blood in the stool  Significant tenderness or worsening of abdominal pains  Swelling of the abdomen that is new, acute  Fever of 100F or higher  For urgent or emergent issues, a gastroenterologist can be reached at any hour by calling [phone].   DIET:  We do recommend a small meal at first, but then you may proceed to your regular diet.  Drink plenty of fluids but you should avoid alcoholic beverages for 24 hours.  ACTIVITY:  You should plan to take it easy for the rest of today and you should NOT DRIVE or use heavy machinery until tomorrow (because of the sedation medicines used during the test).     FOLLOW UP: Our staff will call the number listed on your records the next business day following your procedure to check on you and address any questions or concerns that you may have regarding the information given to you following your procedure. If we do not reach you, we will leave a message.  However, if you are feeling well and you are not experiencing any problems, there is no need to return our call.  We will assume that you have returned to your regular daily activities without incident.  If any biopsies were taken you will be contacted by phone or by letter within the next 1-3 weeks.  Please call us at [phone] if you have not heard about the biopsies in 3 weeks.    SIGNATURES/CONFIDENTIALITY: You and/or your care partner have signed paperwork which will be entered into your electronic medical record.  These signatures attest to the fact that that the information above on your After Visit Summary has been reviewed and is understood.  Full responsibility of the confidentiality of this discharge information lies with you and/or your care-partner.  Hemorrhoids (handout given) Polyps (handout given) Await biopsy results to determined next repeat Colonoscopy

## 2016-12-19 NOTE — Op Note (Signed)
[facility] Patient Name: Dustin Patterson Procedure Date: 12/19/2016 9:40 AM MRN: [id number] Endoscopist: Mauri Pole , MD Age: 50 Referring MD:  Date of Birth: Jul 10, 1966 Gender: Male Account #: [id number] Procedure:                Colonoscopy Indications:              Screening for colorectal malignant neoplasm, This                            is the patient's first colonoscopy Medicines:                Monitored Anesthesia Care Procedure:                Pre-Anesthesia Assessment:                           - Prior to the procedure, a History and Physical                            was performed, and patient medications and                            allergies were reviewed. The patient's tolerance of                            previous anesthesia was also reviewed. The risks                            and benefits of the procedure and the sedation                            options and risks were discussed with the patient.                            All questions were answered, and informed consent                            was obtained. Prior Anticoagulants: The patient has                            taken no previous anticoagulant or antiplatelet                            agents. ASA Grade Assessment: II - A patient with                            mild systemic disease. After reviewing the risks                            and benefits, the patient was deemed in                            satisfactory condition to undergo the procedure.  After obtaining informed consent, the colonoscope                            was passed under direct vision. Throughout the                            procedure, the patient's blood pressure, pulse, and                            oxygen saturations were monitored continuously. The                            Model CF-HQ190L [id number]) scope was introduced                            through the anus  and advanced to the the terminal                            ileum, with identification of the appendiceal                            orifice and IC valve. The colonoscopy was performed                            without difficulty. The patient tolerated the                            procedure well. The quality of the bowel                            preparation was excellent. The terminal ileum,                            ileocecal valve, appendiceal orifice, and rectum                            were photographed. Scope In: 9:42:14 AM Scope Out: 9:50:23 AM Scope Withdrawal Time: 0 hours 7 minutes 13 seconds  Total Procedure Duration: 0 hours 8 minutes 9 seconds  Findings:                 The perianal and digital rectal examinations were                            normal.                           A 3 mm polyp was found in the transverse colon. The                            polyp was sessile. The polyp was removed with a                            cold biopsy forceps. Resection and retrieval were  complete.                           Non-bleeding internal hemorrhoids were found during                            retroflexion. The hemorrhoids were medium-sized. Complications:            No immediate complications. Estimated Blood Loss:     Estimated blood loss was minimal. Impression:               - One 3 mm polyp in the transverse colon, removed                            with a cold biopsy forceps. Resected and retrieved.                           - Non-bleeding internal hemorrhoids. Recommendation:           - Patient has a contact number available for                            emergencies. The signs and symptoms of potential                            delayed complications were discussed with the                            patient. Return to normal activities tomorrow.                            Written discharge instructions were provided to the                             patient.                           - Resume previous diet.                           - Continue present medications.                           - Await pathology results.                           - Repeat colonoscopy in 5-10 years for surveillance                            based on pathology results. Mauri Pole, MD 12/19/2016 9:53:57 AM This report has been signed electronically.

## 2016-12-19 NOTE — Progress Notes (Signed)
Called to room to assist during endoscopic procedure.  Patient ID and intended procedure confirmed with present staff. Received instructions for my participation in the procedure from the performing physician.  

## 2016-12-20 ENCOUNTER — Telehealth: Payer: Self-pay

## 2016-12-20 NOTE — Telephone Encounter (Signed)
Attempted to reach pt. With follow up call after endoscopic procedure yesterday.   No answer.

## 2016-12-27 ENCOUNTER — Encounter: Payer: Self-pay | Admitting: Gastroenterology

## 2018-05-16 ENCOUNTER — Encounter: Payer: Self-pay | Admitting: Registered"

## 2018-05-16 ENCOUNTER — Encounter: Payer: BLUE CROSS/BLUE SHIELD | Attending: Internal Medicine | Admitting: Registered"

## 2018-05-16 DIAGNOSIS — Z713 Dietary counseling and surveillance: Secondary | ICD-10-CM | POA: Insufficient documentation

## 2018-05-16 DIAGNOSIS — E119 Type 2 diabetes mellitus without complications: Secondary | ICD-10-CM | POA: Diagnosis not present

## 2018-05-16 DIAGNOSIS — E1165 Type 2 diabetes mellitus with hyperglycemia: Secondary | ICD-10-CM | POA: Insufficient documentation

## 2018-05-16 NOTE — Progress Notes (Signed)
Diabetes Self-Management Education  Visit Type: First/Initial  Appt. Start Time: 1020 Appt. End Time: 1200  05/16/2018  [name], identified by name and date of birth, is a 52 y.o. male with a diagnosis of Diabetes: Type 2.   ASSESSMENT Pt states he is very motivated to bring down A1c without taking insulin. Pt states the Trulicity has helped a lot to bring down his A1c (from 11.8% in Jan to 9.7% in March). Pt states he has tried [place] but it didn't work and he can't take metformin. Pt states his wife is very strict on what she will let him eat and doesn't let him eat after 8 pm.  Pt states he feels like he is starting to get a headache when his blood sugar is near or below 100 mg/dL. Pt states he has changed is diet a lot in the last couple of months, less fried food and red meat (except during trip to [place]), more fruit and salads, does not eat an entire bag of chips anymore and a lot less fries.  Pt states he was working out a lot but got out of his routine, now only does 3x week. Pt states his job description has changed and is less active at work than he used to be and states it is less stressful now. Pt states he checks his BG often, but not specific timing to food consumption, mostly when it is convenient to check.   Pt states he really enjoys sweets and likes to have ice cream or snack cakes at night. Pt states he only drinks water.   Diabetes Self-Management Education - 05/16/18 1039      Visit Information   Visit Type  First/Initial      Initial Visit   Diabetes Type  Type 2    Are you currently following a meal plan?  No    Are you taking your medications as prescribed?  Yes    Date Diagnosed  2012      Health Coping   How would you rate your overall health?  Good      Psychosocial Assessment   Patient Belief/Attitude about Diabetes  Other (comment) disklike having it    How often do you need to have someone help you when you read instructions, pamphlets,  or other written materials from your doctor or pharmacy?  1 - Never    What is the last grade level you completed in school?  12      Complications   Last HgB A1C per patient/outside source  9.7 %    How often do you check your blood sugar?  1-2 times/day    Fasting Blood glucose range (mg/dL)  70-129;130-179;180-200;>200 130-260 (once was 340, pizza night b4)    Postprandial Blood glucose range (mg/dL)  130-179;180-200;>200    Number of hypoglycemic episodes per month  0 has symptoms when less than 100    Number of hyperglycemic episodes per week  7    Can you tell when your blood sugar is high?  Yes    Have you had a dilated eye exam in the past 12 months?  Yes    Have you had a dental exam in the past 12 months?  Yes    Are you checking your feet?  Yes    How many days per week are you checking your feet?  7      Dietary Intake   Breakfast  fruit, trailmix, plums & green apples, water  Snack (morning)  none    Lunch  cabbage, bean wild rice, grilled jerk chicken    Snack (afternoon)  none OR chips OR [place] brauterwurst    Dinner  salmon croquets beans wild rice, cabbage    Snack (evening)  1 c ice cream 1-2 x week OR oatmeal cakes     Beverage(s)  water,       Exercise   Exercise Type  Moderate (swimming / aerobic walking)    How many days per week to you exercise?  3    How many minutes per day do you exercise?  45    Total minutes per week of exercise  135      Patient Education   Previous Diabetes Education  Yes (please comment) has done 2 classes in [place] since 2012    Disease state   Definition of diabetes, type 1 and 2, and the diagnosis of diabetes    Nutrition management   Role of diet in the treatment of diabetes and the relationship between the three main macronutrients and blood glucose level;Food label reading, portion sizes and measuring food.    Physical activity and exercise   Role of exercise on diabetes management, blood pressure control and cardiac health.     Medications  Reviewed patients medication for diabetes, action, purpose, timing of dose and side effects.    Monitoring  Identified appropriate SMBG and/or A1C goals.    Acute complications  Taught treatment of hypoglycemia - the 15 rule.    Psychosocial adjustment  Role of stress on diabetes      Individualized Goals (developed by patient)   Nutrition  General guidelines for healthy choices and portions discussed    Physical Activity  Exercise 5-7 days per week    Monitoring   test my blood glucose as discussed    Reducing Risk  increase portions of nuts and seeds      Outcomes   Expected Outcomes  Demonstrated interest in learning. Expect positive outcomes    Future DMSE  4-6 wks    Program Status  Completed      Individualized Plan for Diabetes Self-Management Training:   Learning Objective:  Patient will have a greater understanding of diabetes self-management. Patient education plan is to attend individual and/or group sessions per assessed needs and concerns.   Patient Instructions  Fooducate.com is a website where you can compare foods Aim to eat balanced meals and snacks, include protein when eating carbs For meals try to keep to around 80 g carbs and snacks around 30 grams. To help with the insulin resistance issue exercise is key, especially since you cannot take the metformin. Nighttime snack or okay if you are hungry, aim to have balance of carb protein. Consider checking your blood sugar in the morning before eating and 2 hours after some meals   Expected Outcomes:  Demonstrated interest in learning. Expect positive outcomes  Education material provided: A1C conversion sheet and My Plate, medication  If problems or questions, patient to contact team via:  Phone  Future DSME appointment: 4-6 wks

## 2018-05-16 NOTE — Patient Instructions (Addendum)
Fooducate.com is a website where you can compare foods Aim to eat balanced meals and snacks, include protein when eating carbs For meals try to keep to around 80 g carbs and snacks around 30 grams. To help with the insulin resistance issue exercise is key, especially since you cannot take the metformin. Nighttime snack or okay if you are hungry, aim to have balance of carb protein. Consider checking your blood sugar in the morning before eating and 2 hours after some meals

## 2018-06-20 ENCOUNTER — Encounter: Payer: BLUE CROSS/BLUE SHIELD | Attending: Internal Medicine | Admitting: Registered"

## 2018-06-20 DIAGNOSIS — E119 Type 2 diabetes mellitus without complications: Secondary | ICD-10-CM | POA: Diagnosis not present

## 2018-06-20 DIAGNOSIS — Z713 Dietary counseling and surveillance: Secondary | ICD-10-CM | POA: Insufficient documentation

## 2018-06-20 DIAGNOSIS — E1165 Type 2 diabetes mellitus with hyperglycemia: Secondary | ICD-10-CM

## 2018-06-20 NOTE — Patient Instructions (Signed)
Aim to get vigorous activity minimum 10 min per day Consider adding fruit and/or vegetables with your lunch Great job on getting in lots of water Consider getting the smaller drumstick ice cream cones Its okay to have a bun for your burger or [place] dog, but sure to balance the meal with some veggies. Ultimate goal for your fasting morning number is between 80-130 mg/dL

## 2018-06-20 NOTE — Progress Notes (Signed)
Diabetes Self-Management Education  Visit Type: Follow-up  Appt. Start Time: 1035 Appt. End Time: 1110  06/20/2018  [name], identified by name and date of birth, is a 52 y.o. male with a diagnosis of Diabetes: Type 2.   ASSESSMENT Pt BG readings down and with a 7% A1c goal the PPBG is within target range staying below 180, but FBG is above averaging ~165-175 mg/dL. Pt denies symptoms when blood sugar is in the lower end of normal and he is happy because it means his body is getting used to lower (within normal range) sugar levels. Pt states his doctor will reduce medication if his A1c comes down low enough, which is very motivating for him.    Pt states he has not been able to incorporate a workout schedule with projects going on, including remodeling home to prepare for his mother to moving in. Pt states both he and his wife are looking forward to the change. Pt states his mom has T2DM.   Pt states he can work in a 10 min daily vigorous activity while at work. Pt states employer ([employer]) requires all employees to do a stretching routine before starting work.   Pt states he has reduced his ice cream intake by buying the ice cream drumstick cones. Pt states his wife continues to cook most of the meals and pack his lunch and she limit his intake of fried food and bread. Pt states he enjoys cooking, but his wife doesn't let him eat the food he likes to cook.  Diabetes Self-Management Education - 06/20/18 1000      Visit Information   Visit Type  Follow-up      Initial Visit   Diabetes Type  Type 2      Complications   How often do you check your blood sugar?  1-2 times/day    Fasting Blood glucose range (mg/dL)  130-179    Postprandial Blood glucose range (mg/dL)  130-179    Number of hypoglycemic episodes per month  0    Number of hyperglycemic episodes per week  0      Dietary Intake   Breakfast  2 sausage, eggs, fruit OR granola, apple cinnamon    Snack (morning)  none    Lunch  roast beef sand, 2 sm bag chips, pretzels    Snack (afternoon)  none    Dinner  french fries, stir fry broc, shrimp    Snack (evening)  ice cream drumstick     Beverage(s)  water      Exercise   Exercise Type  ADL's    How many days per week to you exercise?  0    How many minutes per day do you exercise?  0    Total minutes per week of exercise  0      Patient Education   Nutrition management   Role of diet in the treatment of diabetes and the relationship between the three main macronutrients and blood glucose level    Physical activity and exercise   Helped patient identify appropriate exercises in relation to his/her diabetes, diabetes complications and other health issue.      Individualized Goals (developed by patient)   Nutrition  General guidelines for healthy choices and portions discussed    Physical Activity  15 minutes per day vigorous    Monitoring   test my blood glucose as discussed      Patient Self-Evaluation of Goals - Patient rates self as  meeting previously set goals (% of time)   Nutrition  >75%    Physical Activity  25 - 50%    Medications  >75%    Reducing Risk  25 - 50%      Outcomes   Expected Outcomes  Demonstrated interest in learning. Expect positive outcomes    Future DMSE  4-6 wks    Program Status  Completed      Subsequent Visit   Since your last visit have you continued or begun to take your medications as prescribed?  Yes    Since your last visit have you experienced any weight changes?  Loss    Weight Loss (lbs)  3    Since your last visit, are you checking your blood glucose at least once a day?  Yes     Individualized Plan for Diabetes Self-Management Training:   Learning Objective:  Patient will have a greater understanding of diabetes self-management. Patient education plan is to attend individual and/or group sessions per assessed needs and concerns.   Patient Instructions  Aim to get vigorous  activity minimum 10 min per day Consider adding fruit and/or vegetables with your lunch Great job on getting in lots of water Consider getting the smaller drumstick ice cream cones Its okay to have a bun for your burger or [place] dog, but sure to balance the meal with some veggies. Ultimate goal for your fasting morning number is between 80-130 mg/dL  Expected Outcomes:  Demonstrated interest in learning. Expect positive outcomes  Education material provided: A1C conversion sheet  If problems or questions, patient to contact team via:  Phone  Future DSME appointment: 4-6 wks

## 2018-08-08 ENCOUNTER — Ambulatory Visit: Payer: BLUE CROSS/BLUE SHIELD | Admitting: Registered"

## 2018-09-05 ENCOUNTER — Encounter: Payer: BLUE CROSS/BLUE SHIELD | Attending: Internal Medicine | Admitting: Registered"

## 2018-09-05 DIAGNOSIS — Z713 Dietary counseling and surveillance: Secondary | ICD-10-CM | POA: Insufficient documentation

## 2018-09-05 DIAGNOSIS — E119 Type 2 diabetes mellitus without complications: Secondary | ICD-10-CM | POA: Diagnosis not present

## 2018-09-05 DIAGNOSIS — E1165 Type 2 diabetes mellitus with hyperglycemia: Secondary | ICD-10-CM

## 2018-09-05 NOTE — Patient Instructions (Addendum)
Good to see your fasting blood sugar is coming down! Continue including plenty of fruits and vegetables and the balanced meals you cook at night. Be sure to always include protein when eating carbohydrates. Example: Fruit and nuts - use the snack sheet for ideas PB&J sandwich should be fine. You can check your blood sugar 2 hrs after to double check.

## 2018-09-05 NOTE — Progress Notes (Signed)
Diabetes Self-Management Education  Visit Type: Follow-up  Appt. Start Time: 1110    Appt. End Time: 1150  09/05/2018  [name], identified by name and date of birth, is a 52 y.o. male with a diagnosis of Diabetes: Type 2.   ASSESSMENT Pt states his FBG has come down closer to 130-140s and although it is not at goal yet is happy that he is staying consistent. Pt states he has worked with his doctor to come up with strategies to make sure he gets both doses of glipizide each day.  Pt states his 27 yr old mother now lives with him and she requests that he lightly steam vegetables so they are still crunchy and she wants to eat between 5-6 pm so it helps him stay on more of an eating schedule. Pt states his blood pressure is great and credits his wife for not allowing high sodium foods in the house.  Pt states he reduced the amount of ice cream he is eating at night. Pt states he is very motivated to bring down A1c to 6% so he can come off medications. Pt states he hopes to continue to be able to resist foods he knows he shouldn't eat. RD discussed strategies to help control portion size of some of his favorite foods instead of totally restricting them leading to overindulgence.  Diabetes Self-Management Education - 09/05/18 1100      Visit Information   Visit Type  Follow-up      Initial Visit   Diabetes Type  Type 2      Complications   How often do you check your blood sugar?  1-2 times/day    Fasting Blood glucose range (mg/dL)  130-179   130-145   Number of hypoglycemic episodes per month  0    Number of hyperglycemic episodes per week  0      Dietary Intake   Breakfast  cereal, fruit (45g), OR eggs, sausage    Snack (morning)  occassionally dried fruit, nuts    Lunch  meat, vegetables    Snack (afternoon)  none    Snack (evening)  ice cream (smaller)    Beverage(s)  water, crystal light      Exercise   Exercise Type  Light (walking / raking leaves)    How many  days per week to you exercise?  3      Patient Education   Nutrition management   Role of diet in the treatment of diabetes and the relationship between the three main macronutrients and blood glucose level      Individualized Goals (developed by patient)   Nutrition  General guidelines for healthy choices and portions discussed    Medications  take my medication as prescribed    Monitoring   test my blood glucose as discussed      Patient Self-Evaluation of Goals - Patient rates self as meeting previously set goals (% of time)   Nutrition  50 - 75 %   not balancing fruit with protein   Monitoring  >75%      Outcomes   Expected Outcomes  Demonstrated interest in learning. Expect positive outcomes    Future DMSE  PRN    Program Status  Completed      Subsequent Visit   Since your last visit have you continued or begun to take your medications as prescribed?  Yes    Since your last visit have you had your blood pressure checked?  Yes  Is your most recent blood pressure lower, unchanged, or higher since your last visit?  Unchanged    Since your last visit have you experienced any weight changes?  Gain    Weight Gain (lbs)  3    Since your last visit, are you checking your blood glucose at least once a day?  Yes      Individualized Plan for Diabetes Self-Management Training:   Learning Objective:  Patient will have a greater understanding of diabetes self-management. Patient education plan is to attend individual and/or group sessions per assessed needs and concerns.  Patient Instructions  Good to see your fasting blood sugar is coming down! Continue including plenty of fruits and vegetables and the balanced meals you cook at night. Be sure to always include protein when eating carbohydrates. Example: Fruit and nuts - use the snack sheet for ideas PB&J sandwich should be fine. You can check your blood sugar 2 hrs after to double check.  Expected Outcomes:  Demonstrated interest  in learning. Expect positive outcomes  Education material provided: Snack sheet  If problems or questions, patient to contact team via:  Phone  Future DSME appointment: PRN

## 2019-08-31 ENCOUNTER — Other Ambulatory Visit: Payer: Self-pay

## 2019-08-31 ENCOUNTER — Encounter: Payer: Self-pay | Admitting: Emergency Medicine

## 2019-08-31 ENCOUNTER — Ambulatory Visit
Admission: EM | Admit: 2019-08-31 | Discharge: 2019-08-31 | Disposition: A | Discharge status: Home / Self Care | Payer: BC Managed Care – PPO | Attending: Family Medicine | Admitting: Family Medicine

## 2019-08-31 DIAGNOSIS — S39012A Strain of muscle, fascia and tendon of lower back, initial encounter: Secondary | ICD-10-CM | POA: Diagnosis not present

## 2019-08-31 DIAGNOSIS — Y999 Unspecified external cause status: Secondary | ICD-10-CM | POA: Diagnosis not present

## 2019-08-31 LAB — URINALYSIS, COMPLETE (UACMP) WITH MICROSCOPIC
Bacteria, UA: NONE SEEN
Bilirubin Urine: NEGATIVE
Glucose, UA: >1000 mg/dL — AB
Hgb urine dipstick: NEGATIVE
Leukocytes,Ua: NEGATIVE
Nitrite: NEGATIVE
Protein, ur: NEGATIVE mg/dL
RBC / HPF: NONE SEEN RBC/hpf (ref 0–5)
Specific Gravity, Urine: 1.02 (ref 1.005–1.030)
WBC, UA: NONE SEEN WBC/hpf (ref 0–5)
pH: 6 (ref 5.0–8.0)

## 2019-08-31 MED ORDER — HYDROCODONE-ACETAMINOPHEN 5-325 MG PO TABS: ORAL_TABLET | ORAL | 0 refills | Status: DC

## 2019-08-31 MED ORDER — MELOXICAM 15 MG PO TABS: 15.0000 mg | ORAL_TABLET | Freq: Every day | ORAL | 0 refills | Status: DC

## 2019-08-31 MED ORDER — CYCLOBENZAPRINE HCL 10 MG PO TABS: 10.0000 mg | ORAL_TABLET | Freq: Every day | ORAL | 0 refills | Status: DC

## 2019-08-31 NOTE — Discharge Instructions (Signed)
Heat, easy stretching

## 2019-08-31 NOTE — ED Triage Notes (Signed)
Pt c/o low back pain on the left side. Started yesterday and getting worse. He states that he felt the pain radiate to his left thigh when he bends over. Denies injury, or urinary symptoms.

## 2019-08-31 NOTE — ED Provider Notes (Signed)
[facility]    CSN: [id number] Arrival date & time: 08/31/19  1534      History   Chief Complaint Chief Complaint  Patient presents with  . Back Pain    HPI Dustin Patterson is a 53 y.o. male.   53 yo male with a c/o left low back pain since yesterday. Denies any falls or injuries. Worse when bending at the waist, seems to radiate to the left thigh. Denies any numbness/tingling, bowel or bladder problems, dysuria, hematuria, fevers, chills, abdominal pain.      Past Medical History:  Diagnosis Date  . Allergy    past hx   . Diabetes mellitus without complication ([place])   . Hypertension     Patient Active Problem List   Diagnosis Date Noted  . Uncontrolled type 2 diabetes mellitus with hyperglycemia ([place]) 05/16/2018    Past Surgical History:  Procedure Laterality Date  . NO PAST SURGERIES         Home Medications    Prior to Admission medications   Medication Sig Start Date End Date Taking? Authorizing Provider  amLODipine (NORVASC) 10 MG tablet Take 10 mg by mouth daily.   Yes [provider]  atorvastatin (LIPITOR) 10 MG tablet Take 10 mg by mouth daily at 6 PM.   Yes [provider]  Dulaglutide (TRULICITY) 1.5 0000000 SOPN Inject into the skin.   Yes [provider]  glipiZIDE (GLUCOTROL) 10 MG tablet Take 10 mg by mouth daily before breakfast.   Yes [provider]  lisinopril (PRINIVIL,ZESTRIL) 40 MG tablet Take 40 mg by mouth daily.   Yes [provider]  metoprolol succinate (TOPROL-XL) 25 MG 24 hr tablet Take 25 mg by mouth daily.   Yes [provider]  cyclobenzaprine (FLEXERIL) 10 MG tablet Take 1 tablet (10 mg total) by mouth at bedtime. 08/31/19   Norval Gable, MD  HYDROcodone-acetaminophen (NORCO/VICODIN) 5-325 MG tablet 1-2 tabs po bid prn 08/31/19   Norval Gable, MD  meloxicam (MOBIC) 15 MG tablet Take 1 tablet (15 mg total) by mouth daily. 08/31/19   Norval Gable, MD   METFORMIN HCL PO Take 250 mg by mouth as needed.    [provider]  metoprolol tartrate (LOPRESSOR) 25 MG tablet Take 25 mg by mouth daily.    [provider]    Family History Family History  Problem Relation Age of Onset  . Prostate cancer Father   . Colon cancer Neg Hx   . Colon polyps Neg Hx   . Esophageal cancer Neg Hx   . Rectal cancer Neg Hx   . Stomach cancer Neg Hx     Social History Social History   Tobacco Use  . Smoking status: Former Smoker    Quit date: 2010    Years since quitting: 10.6  . Smokeless tobacco: Never Used  . Tobacco comment: very occassional former smoker  Substance Use Topics  . Alcohol use: Yes    Comment: occasionally   . Drug use: No     Allergies   Patient has no known allergies.   Review of Systems Review of Systems   Physical Exam Triage Vital Signs ED Triage Vitals  Enc Vitals Group     BP 08/31/19 1614 (!) 151/106     Pulse Rate 08/31/19 1614 68     Resp 08/31/19 1614 18     Temp 08/31/19 1614 98 F (36.7 C)     Temp Source 08/31/19 1614 Oral  SpO2 08/31/19 1614 96 %     Weight 08/31/19 1609 204 lb (92.5 kg)     Height 08/31/19 1609 6' 0.75" (1.848 m)     Head Circumference --      Peak Flow --      Pain Score 08/31/19 1609 9     Pain Loc --      Pain Edu? --      Excl. in [place]? --    No data found.  Updated Vital Signs BP (!) 151/106 (BP Location: Left Arm)   Pulse 68   Temp 98 F (36.7 C) (Oral)   Resp 18   Ht 6' 0.75" (1.848 m)   Wt 92.5 kg   SpO2 96%   BMI 27.10 kg/m   Visual Acuity Right Eye Distance:   Left Eye Distance:   Bilateral Distance:    Right Eye Near:   Left Eye Near:    Bilateral Near:     Physical Exam Vitals signs and nursing note reviewed.  Constitutional:      General: He is not in acute distress.    Appearance: He is not toxic-appearing or diaphoretic.  Musculoskeletal:     Lumbar back: He exhibits tenderness (over the lumbar paraspinous muscles) and  spasm. He exhibits normal range of motion, no bony tenderness, no swelling, no edema, no deformity, no laceration and normal pulse.  Neurological:     Mental Status: He is alert.      UC Treatments / Results  Labs (all labs ordered are listed, but only abnormal results are displayed) Labs Reviewed  URINALYSIS, COMPLETE (UACMP) WITH MICROSCOPIC - Abnormal; Notable for the following components:      Result Value   Color, Urine STRAW (*)    Glucose, UA >1,000 (*)    Ketones, ur TRACE (*)    All other components within normal limits    EKG   Radiology No results found.  Procedures Procedures (including critical care time)  Medications Ordered in UC Medications - No data to display  Initial Impression / Assessment and Plan / UC Course  I have reviewed the triage vital signs and the nursing notes.  Pertinent labs & imaging results that were available during my care of the patient were reviewed by me and considered in my medical decision making (see chart for details).      Final Clinical Impressions(s) / UC Diagnoses   Final diagnoses:  Strain of lumbar region, initial encounter     Discharge Instructions     Heat, easy stretching    ED Prescriptions    Medication Sig Dispense Auth. Provider   cyclobenzaprine (FLEXERIL) 10 MG tablet Take 1 tablet (10 mg total) by mouth at bedtime. 30 tablet Norval Gable, MD   meloxicam (MOBIC) 15 MG tablet Take 1 tablet (15 mg total) by mouth daily. 30 tablet Norval Gable, MD   HYDROcodone-acetaminophen (NORCO/VICODIN) 5-325 MG tablet 1-2 tabs po bid prn 8 tablet Norval Gable, MD      1. diagnosis reviewed with patient 2. rx as per orders above; reviewed possible side effects, interactions, risks and benefits  3. Recommend supportive treatment as above 4. Follow-up prn if symptoms worsen or don't improve   Controlled Substance Prescriptions [place] Controlled Substance Registry consulted? Yes, I have consulted the   Controlled Substances Registry for this patient, and feel the risk/benefit ratio today is favorable for proceeding with this prescription for a controlled substance.   Norval Gable, MD 08/31/19 2101

## 2019-10-05 ENCOUNTER — Ambulatory Visit (INDEPENDENT_AMBULATORY_CARE_PROVIDER_SITE_OTHER): Payer: BC Managed Care – PPO

## 2019-10-05 ENCOUNTER — Ambulatory Visit
Admission: EM | Admit: 2019-10-05 | Discharge: 2019-10-05 | Disposition: A | Discharge status: Home / Self Care | Payer: BC Managed Care – PPO | Attending: Family Medicine | Admitting: Family Medicine

## 2019-10-05 ENCOUNTER — Other Ambulatory Visit: Payer: Self-pay

## 2019-10-05 DIAGNOSIS — W2209XA Striking against other stationary object, initial encounter: Secondary | ICD-10-CM

## 2019-10-05 DIAGNOSIS — S92515A Nondisplaced fracture of proximal phalanx of left lesser toe(s), initial encounter for closed fracture: Secondary | ICD-10-CM | POA: Diagnosis not present

## 2019-10-05 DIAGNOSIS — M79675 Pain in left toe(s): Secondary | ICD-10-CM | POA: Diagnosis not present

## 2019-10-05 NOTE — ED Provider Notes (Signed)
[facility] ____________________________________________  Time seen: Approximately 9:00 AM  I have reviewed the triage vital signs and the nursing notes.   HISTORY  Chief Complaint Toe Injury   HPI Dustin Patterson is a 53 y.o. male presenting for evaluation of left fifth toe pain after injury that occurred yesterday.  Patient reports he actually hit his toe on the platform causing it to go outwards.  States he pushed it back in allowing it to go straight again but has had continued pain.  Able to still walk.  States he did ice it throughout the night.  Denies paresthesias or pain radiation.  Denies other injuries.  Reports otherwise doing well.  No recent cough or sickness.  Elwyn Reach, MD: PCP    Past Medical History:  Diagnosis Date  . Allergy    past hx   . Diabetes mellitus without complication ([place])   . Hypertension     Patient Active Problem List   Diagnosis Date Noted  . Uncontrolled type 2 diabetes mellitus with hyperglycemia ([place]) 05/16/2018    Past Surgical History:  Procedure Laterality Date  . NO PAST SURGERIES        Current Facility-Administered Medications:  .  0.9 %  sodium chloride infusion, 500 mL, Intravenous, Continuous, Nandigam, Kavitha V, MD  Current Outpatient Medications:  .  amLODipine (NORVASC) 10 MG tablet, Take 10 mg by mouth daily., Disp: , Rfl:  .  atorvastatin (LIPITOR) 10 MG tablet, Take 10 mg by mouth daily at 6 PM., Disp: , Rfl:  .  cyclobenzaprine (FLEXERIL) 10 MG tablet, Take 1 tablet (10 mg total) by mouth at bedtime., Disp: 30 tablet, Rfl: 0 .  Dulaglutide (TRULICITY) 1.5 0000000 SOPN, Inject into the skin., Disp: , Rfl:  .  glipiZIDE (GLUCOTROL) 10 MG tablet, Take 10 mg by mouth daily before breakfast., Disp: , Rfl:  .  lisinopril (PRINIVIL,ZESTRIL) 40 MG tablet, Take 40 mg by mouth daily., Disp: , Rfl:  .  metoprolol succinate (TOPROL-XL) 25 MG 24 hr tablet, Take 25 mg by mouth daily., Disp: , Rfl:  .   HYDROcodone-acetaminophen (NORCO/VICODIN) 5-325 MG tablet, 1-2 tabs po bid prn, Disp: 8 tablet, Rfl: 0 .  meloxicam (MOBIC) 15 MG tablet, Take 1 tablet (15 mg total) by mouth daily., Disp: 30 tablet, Rfl: 0 .  METFORMIN HCL PO, Take 250 mg by mouth as needed., Disp: , Rfl:  .  metoprolol tartrate (LOPRESSOR) 25 MG tablet, Take 25 mg by mouth daily., Disp: , Rfl:   Allergies Patient has no known allergies.  Family History  Problem Relation Age of Onset  . Prostate cancer Father   . Colon cancer Neg Hx   . Colon polyps Neg Hx   . Esophageal cancer Neg Hx   . Rectal cancer Neg Hx   . Stomach cancer Neg Hx     Social History Social History   Tobacco Use  . Smoking status: Former Smoker    Quit date: 2010    Years since quitting: 10.7  . Smokeless tobacco: Never Used  . Tobacco comment: very occassional former smoker  Substance Use Topics  . Alcohol use: Yes    Comment: occasionally   . Drug use: No    Review of Systems Constitutional: No fever ENT: No sore throat. Cardiovascular: Denies chest pain. Respiratory: Denies shortness of breath. Gastrointestinal: No abdominal pain.  Musculoskeletal: Positive left fifth toe pain. Skin: Negative for rash.  ____________________________________________   PHYSICAL EXAM:  VITAL SIGNS: ED Triage  Vitals  Enc Vitals Group     BP 10/05/19 0833 (!) 173/106     Pulse Rate 10/05/19 0833 62     Resp 10/05/19 0833 16     Temp 10/05/19 0833 98.1 F (36.7 C)     Temp Source 10/05/19 0833 Oral     SpO2 10/05/19 0833 100 %     Weight 10/05/19 0831 206 lb (93.4 kg)     Height 10/05/19 0831 6' 0.75" (1.848 m)     Head Circumference --      Peak Flow --      Pain Score 10/05/19 0831 9     Pain Loc --      Pain Edu? --      Excl. in [place]? --     Constitutional: Alert and oriented. Well appearing and in no acute distress. Eyes: Conjunctivae are normal.  ENT      Head: Normocephalic and atraumatic. Cardiovascular: Normal rate,  regular rhythm. Grossly normal heart sounds.  Good peripheral circulation. Respiratory: Normal respiratory effort without tachypnea nor retractions. Breath sounds are clear and equal bilaterally. No wheezes, rales, rhonchi. Musculoskeletal: Steady gait.  Bilateral pedal pulses equal and easily palpated.   Except left fifth toe proximal phalanx mild to moderate tenderness to direct palpation, minimal swelling, normal distal sensation, skin intact, normal-appearing alignment. Neurologic:  Normal speech and language.  Skin:  Skin is warm, dry and intact. No rash noted. Psychiatric: Mood and affect are normal. Speech and behavior are normal. Patient exhibits appropriate insight and judgment   ___________________________________________   LABS (all labs ordered are listed, but only abnormal results are displayed)  Labs Reviewed - No data to display  RADIOLOGY  Dg Toe 5th Left  Result Date: 10/05/2019 CLINICAL DATA:  Pain EXAM: DG TOE 5TH LEFT COMPARISON:  None. FINDINGS: There is an acute oblique fracture through the proximal phalanx of the fifth digit. There is no dislocation. There is surrounding soft tissue swelling. IMPRESSION: Acute fracture of the proximal phalanx of the fifth digit. Electronically Signed   By: Constance Holster M.D.   On: 10/05/2019 09:13   ____________________________________________   PROCEDURES Procedures    INITIAL IMPRESSION / ASSESSMENT AND PLAN / ED COURSE  Pertinent labs & imaging results that were available during my care of the patient were reviewed by me and considered in my medical decision making (see chart for details).  Well-appearing patient.  No acute distress.  Left fifth toe injury, x-ray as above, acute fracture proximal phalanx of the fifth digit.  Buddy tape fourth and fifth toes.  Postop shoe given for support.  Over-the-counter Tylenol, ice, rest and supportive care.  Work note given for today and tomorrow.  Follow-up podiatry in 1 to 2  weeks.  Discussed follow up with Primary care physician this week. Discussed follow up and return parameters including no resolution or any worsening concerns. Patient verbalized understanding and agreed to plan.   ____________________________________________   FINAL CLINICAL IMPRESSION(S) / ED DIAGNOSES  Final diagnoses:  Closed nondisplaced fracture of proximal phalanx of lesser toe of left foot, initial encounter     ED Discharge Orders    None       Note: This dictation was prepared with Dragon dictation along with smaller phrase technology. Any transcriptional errors that result from this process are unintentional.         Marylene Land, NP 10/05/19 [id number]

## 2019-10-05 NOTE — Discharge Instructions (Signed)
Ice. Keep buddy tape. Use post op shoe.   Follow up with podiatry in 1-2 weeks as needed.   Follow up with your primary care physician this week as needed. Return to Urgent care for new or worsening concerns.

## 2019-10-05 NOTE — ED Triage Notes (Signed)
Patient states that he hit his little toe on his left foot last night. Patient states that toe is painful to put pressure on this morning.

## 2020-05-09 ENCOUNTER — Ambulatory Visit (INDEPENDENT_AMBULATORY_CARE_PROVIDER_SITE_OTHER): Payer: BC Managed Care – PPO

## 2020-05-09 ENCOUNTER — Other Ambulatory Visit: Payer: Self-pay

## 2020-05-09 ENCOUNTER — Ambulatory Visit
Admission: EM | Admit: 2020-05-09 | Discharge: 2020-05-09 | Disposition: A | Discharge status: Home / Self Care | Payer: BC Managed Care – PPO | Attending: Emergency Medicine | Admitting: Emergency Medicine

## 2020-05-09 ENCOUNTER — Encounter: Payer: Self-pay | Admitting: Emergency Medicine

## 2020-05-09 DIAGNOSIS — S86001A Unspecified injury of right Achilles tendon, initial encounter: Secondary | ICD-10-CM

## 2020-05-09 DIAGNOSIS — M25571 Pain in right ankle and joints of right foot: Secondary | ICD-10-CM | POA: Diagnosis not present

## 2020-05-09 MED ORDER — MELOXICAM 15 MG PO TABS: 15.0000 mg | ORAL_TABLET | Freq: Every day | ORAL | 0 refills | Status: DC | PRN

## 2020-05-09 NOTE — Discharge Instructions (Signed)
Take medication as prescribed. Rest.  Ice.  Wear boot.  Follow-up with orthopedic in 1 week as discussed.  Follow up with your primary care physician this week as needed. Return to Urgent care for new or worsening concerns.

## 2020-05-09 NOTE — ED Provider Notes (Signed)
[facility] ____________________________________________  Time seen: Approximately 8:33 AM  I have reviewed the triage vital signs and the nursing notes.   HISTORY  Chief Complaint Ankle Injury (right) and Ankle Pain  HPI Dustin Patterson is a 54 y.o. male presenting for evaluation of right ankle pain post injury that occurred yesterday while walking his dog.  States that his dog pulled on the leash and in trying to keep his balance he hit his heel very hard on the concrete.  Reports initially mild pain but is able to continue to walk home.  States after resting it pain increased.  States pain with moving his foot up and down as well as putting weight.  States pain is worse with ambulating.  Denies pain radiation, paresthesias, other injuries.  Did apply ice and take Tylenol.  Denies other aggravating alleviating factors reports otherwise doing well.  Elwyn Reach, MD : PCP   Past Medical History:  Diagnosis Date  . Allergy    past hx   . Diabetes mellitus without complication ([place])   . Hypertension     Patient Active Problem List   Diagnosis Date Noted  . Uncontrolled type 2 diabetes mellitus with hyperglycemia ([place]) 05/16/2018    Past Surgical History:  Procedure Laterality Date  . NO PAST SURGERIES        Current Facility-Administered Medications:  .  0.9 %  sodium chloride infusion, 500 mL, Intravenous, Continuous, Nandigam, Kavitha V, MD  Current Outpatient Medications:  .  amLODipine (NORVASC) 10 MG tablet, Take 10 mg by mouth daily., Disp: , Rfl:  .  atorvastatin (LIPITOR) 10 MG tablet, Take 10 mg by mouth daily at 6 PM., Disp: , Rfl:  .  Dulaglutide (TRULICITY) 1.5 0000000 SOPN, Inject into the skin., Disp: , Rfl:  .  glipiZIDE (GLUCOTROL) 10 MG tablet, Take 10 mg by mouth daily before breakfast., Disp: , Rfl:  .  lisinopril (PRINIVIL,ZESTRIL) 40 MG tablet, Take 40 mg by mouth daily., Disp: , Rfl:  .  meloxicam (MOBIC) 15 MG tablet, Take 1  tablet (15 mg total) by mouth daily as needed., Disp: 10 tablet, Rfl: 0  Allergies Patient has no known allergies.  Family History  Problem Relation Age of Onset  . Prostate cancer Father   . Colon cancer Neg Hx   . Colon polyps Neg Hx   . Esophageal cancer Neg Hx   . Rectal cancer Neg Hx   . Stomach cancer Neg Hx     Social History Social History   Tobacco Use  . Smoking status: Former Smoker    Quit date: 2010    Years since quitting: 11.3  . Smokeless tobacco: Never Used  . Tobacco comment: very occassional former smoker  Substance Use Topics  . Alcohol use: Yes    Comment: occasionally   . Drug use: No    Review of Systems Constitutional: No fever ENT: No sore throat. Cardiovascular: Denies chest pain. Respiratory: Denies shortness of breath. Musculoskeletal: Positive right foot pain. Skin: Negative for rash.  ____________________________________________   PHYSICAL EXAM:  VITAL SIGNS: ED Triage Vitals  Enc Vitals Group     BP 05/09/20 0820 (!) 141/103     Pulse Rate 05/09/20 0820 70     Resp 05/09/20 0820 18     Temp 05/09/20 0820 98.3 F (36.8 C)     Temp Source 05/09/20 0820 Oral     SpO2 05/09/20 0820 97 %     Weight 05/09/20 0818 195 lb (  88.5 kg)     Height 05/09/20 0818 6' 0.75" (1.848 m)     Head Circumference --      Peak Flow --      Pain Score 05/09/20 0818 9     Pain Loc --      Pain Edu? --      Excl. in [place]? --     Constitutional: Alert and oriented. Well appearing and in no acute distress. Eyes: Conjunctivae are normal.  ENT      Head: Normocephalic and atraumatic. Cardiovascular: Good peripheral circulation. Musculoskeletal: Bilateral pedal pulses equal and easily palpated. Except: Right lateral malleolus mild tenderness direct palpation with tenderness along ATFL and posterior heel with mild tenderness along distal Achilles, negative Thompson squeeze test bilaterally, right calf nontender, right lower extremity otherwise  nontender, normal sensation capillary refill. Neurologic:  Normal speech and language. No gross focal neurologic deficits are appreciated.  Skin:  Skin is warm, dry and intact. No rash noted. Psychiatric: Mood and affect are normal. Speech and behavior are normal. Patient exhibits appropriate insight and judgment   ___________________________________________   LABS (all labs ordered are listed, but only abnormal results are displayed)  Labs Reviewed - No data to display ____________________________________________  RADIOLOGY  DG Ankle Complete Right  Result Date: 05/09/2020 CLINICAL DATA:  Pain after twisting injury EXAM: RIGHT ANKLE - COMPLETE 3+ VIEW COMPARISON:  None. FINDINGS: Frontal, oblique, and lateral views were obtained. No evident acute fracture or joint effusion. A small well corticated calcification in the medial malleolus region may represent residua of prior injury in this area. There is no appreciable joint space narrowing or erosion. Ankle mortise appears intact. There is a minimal inferior calcaneal spur. IMPRESSION: No acute appearing fracture. Question residua of old trauma in the medial malleolus. No appreciable joint space narrowing. Small calcaneal spur inferiorly. Ankle mortise appears intact. Electronically Signed   By: Lowella Grip III M.D.   On: 05/09/2020 08:46   ____________________________________________   PROCEDURES Procedures     INITIAL IMPRESSION / ASSESSMENT AND PLAN / ED COURSE  Pertinent labs & imaging results that were available during my care of the patient were reviewed by me and considered in my medical decision making (see chart for details).  Well-appearing patient.  Weight ankle injury as above.  Ambulatory.  X-ray as above radiologist, reviewed.  Negative Thompson squeeze test.  Suspect sprain versus strain injury, also discussed potential for partial tear to Achilles.  Recommend cam boot, rest, ice, Mobic and follow-up with  orthopedic in 1 week.Discussed indication, risks and benefits of medications with patient.   Discussed follow up with Primary care physician this week as needed. Discussed follow up and return parameters including no resolution or any worsening concerns. Patient verbalized understanding and agreed to plan.   ____________________________________________   FINAL CLINICAL IMPRESSION(S) / ED DIAGNOSES  Final diagnoses:  Acute right ankle pain  Injury of right Achilles tendon, initial encounter     ED Discharge Orders         Ordered    meloxicam (MOBIC) 15 MG tablet  Daily PRN     05/09/20 0857           Note: This dictation was prepared with Dragon dictation along with smaller phrase technology. Any transcriptional errors that result from this process are unintentional.         Marylene Land, NP 05/09/20 1029

## 2020-05-09 NOTE — ED Triage Notes (Signed)
Patient c/o right ankle/heel injury while walking his dog yesterday. He is c/o right ankle/heel pain.

## 2020-09-16 IMAGING — CR DG TOE 5TH 2+V*L*
3 series · 3 of 3 positions shown · non-contrast
Comparison: None.

CLINICAL DATA: Pain

EXAM:
DG TOE 5TH LEFT

[toe ap]
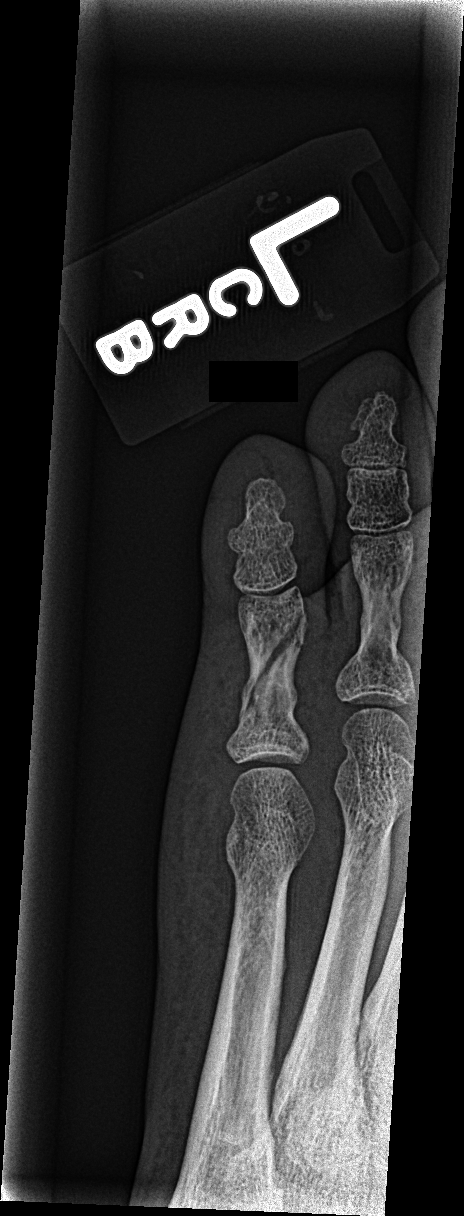

[toe obl]
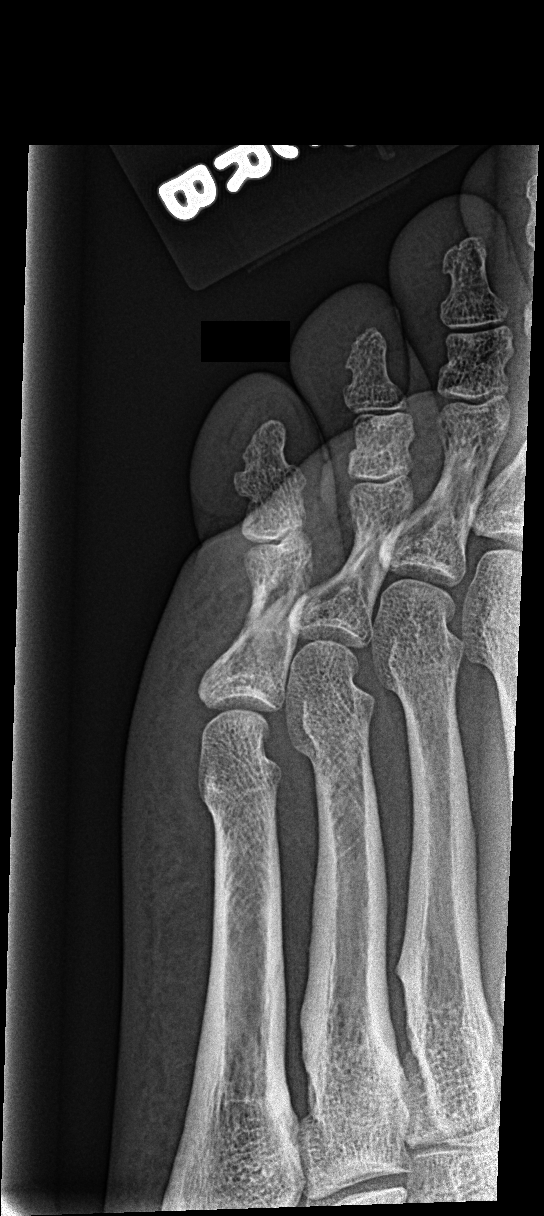

[toe lat]
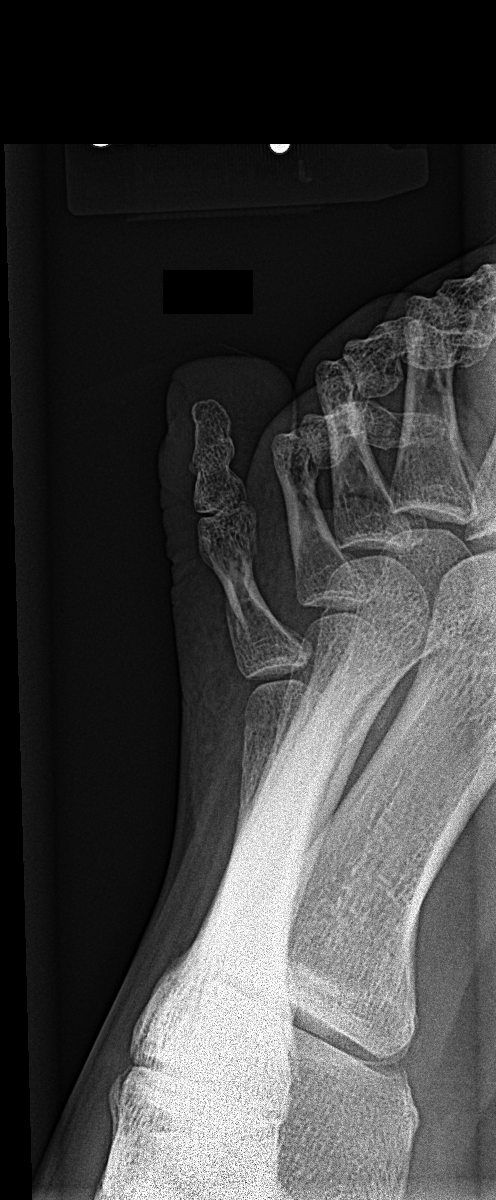

[3 of 3 positions shown; findings below may reference images not displayed]

FINDINGS: There is an acute oblique fracture through the proximal phalanx of
the fifth digit. There is no dislocation. There is surrounding soft
tissue swelling.
IMPRESSION: Acute fracture of the proximal phalanx of the fifth digit.

## 2021-04-21 IMAGING — CR DG ANKLE COMPLETE 3+V*R*
3 series · 3 of 3 positions shown · non-contrast
Comparison: None.

CLINICAL DATA: Pain after twisting injury

EXAM:
RIGHT ANKLE - COMPLETE 3+ VIEW

[ankle ap]
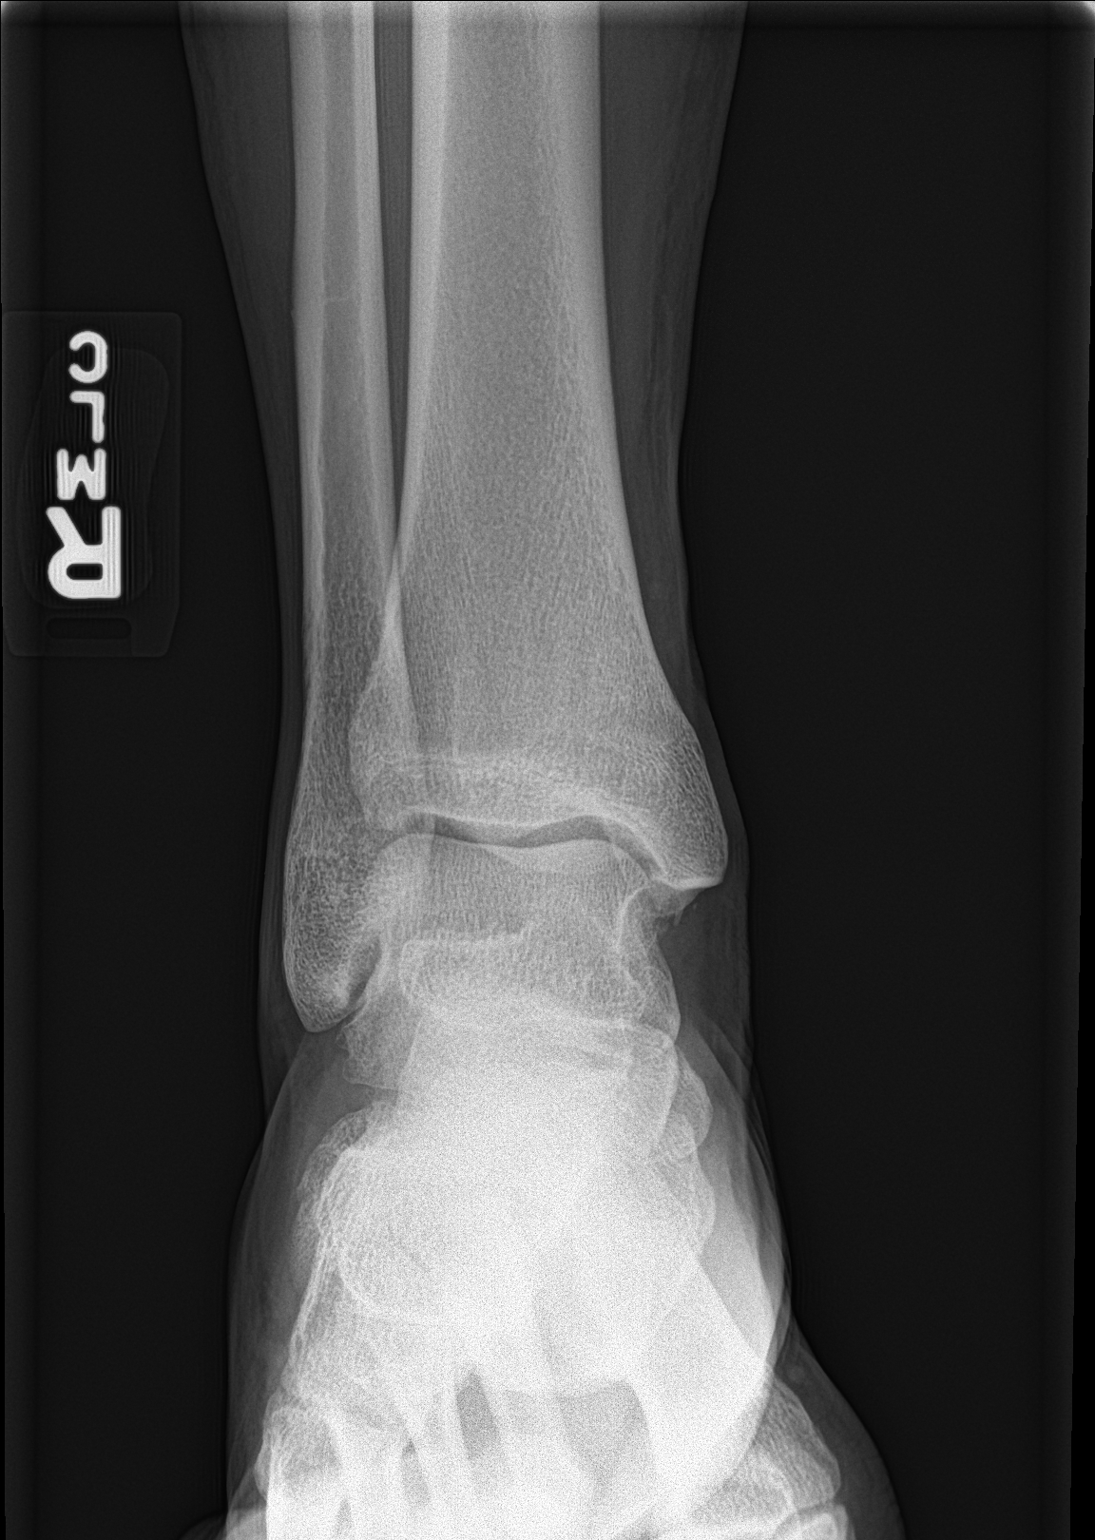

[ankle obl]
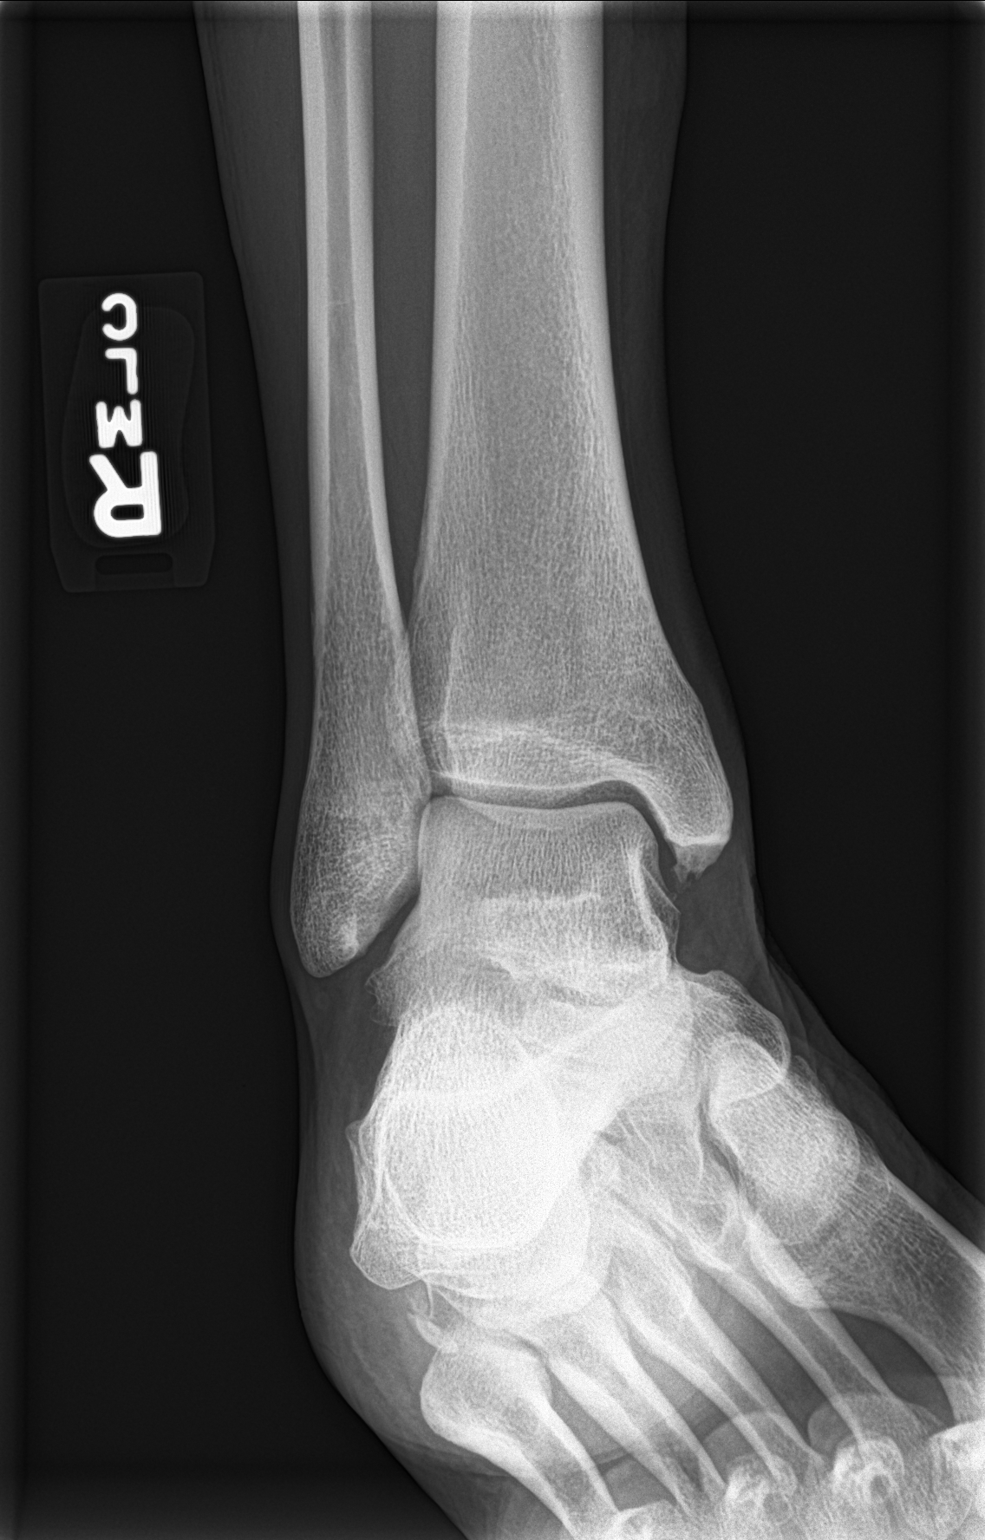

[ankle lat]
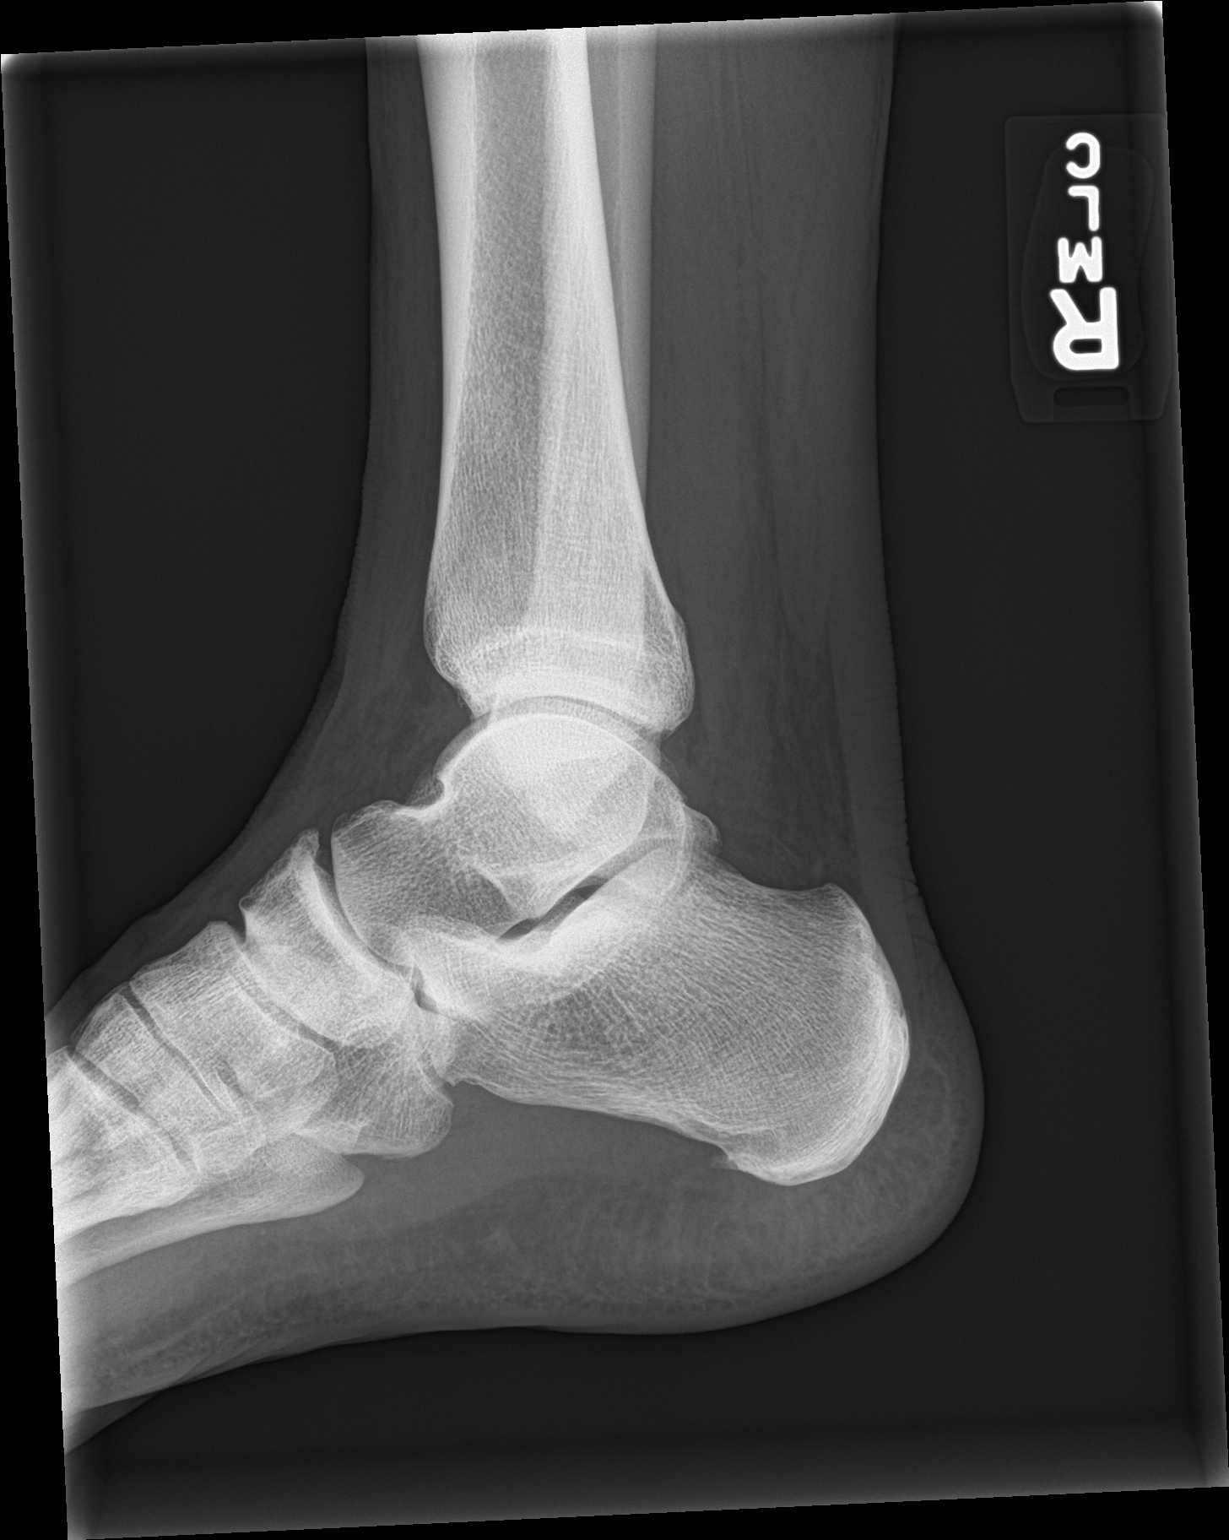

[3 of 3 positions shown; findings below may reference images not displayed]

FINDINGS: Frontal, oblique, and lateral views were obtained. No evident acute
fracture or joint effusion. A small well corticated calcification in
the medial malleolus region may represent residua of prior injury in
this area. There is no appreciable joint space narrowing or erosion.
Ankle mortise appears intact. There is a minimal inferior calcaneal
spur.
IMPRESSION: No acute appearing fracture. Question residua of old trauma in the
medial malleolus. No appreciable joint space narrowing. Small
calcaneal spur inferiorly. Ankle mortise appears intact.

## 2022-08-16 ENCOUNTER — Ambulatory Visit (INDEPENDENT_AMBULATORY_CARE_PROVIDER_SITE_OTHER): Payer: BC Managed Care – PPO

## 2022-08-16 ENCOUNTER — Ambulatory Visit
Admission: EM | Admit: 2022-08-16 | Discharge: 2022-08-16 | Disposition: A | Discharge status: Home / Self Care | Payer: BC Managed Care – PPO | Attending: Family Medicine | Admitting: Family Medicine

## 2022-08-16 DIAGNOSIS — M25511 Pain in right shoulder: Secondary | ICD-10-CM

## 2022-08-16 MED ORDER — KETOROLAC TROMETHAMINE 60 MG/2ML IM SOLN
30.0000 mg | Freq: Once | INTRAMUSCULAR | Status: AC
  Administered 2022-08-16: 30 mg via INTRAMUSCULAR

## 2022-08-16 MED ORDER — GABAPENTIN 100 MG PO CAPS: 100.0000 mg | ORAL_CAPSULE | Freq: Three times a day (TID) | ORAL | 0 refills | Status: DC

## 2022-08-16 MED ORDER — NAPROXEN 500 MG PO TABS: 500.0000 mg | ORAL_TABLET | Freq: Two times a day (BID) | ORAL | 0 refills | Status: AC

## 2022-08-16 NOTE — ED Provider Notes (Signed)
[facility]    CSN: [id number] Arrival date & time: 08/16/22  1104      History   Chief Complaint Chief Complaint  Patient presents with   Shoulder Pain    Right      HPI Dustin Patterson is a 56 y.o. male.   HPI  Dustin Patterson presents for right lateral shoulder pain for the past 5 days.  Points in a circular fashion on the right shoulder.  States that he had just got back from [place] when the pain started.  Of note, he played football baseball and basketball when he was younger.  Denies recent trauma or previous injury to his shoulder. Alleve and tylenol did not help.  Movement makes pain worse.  Pain radiates up to his neck and then down his arm.  Denies chest pain or pressure.  He is right-handed.  Denies weakness.  Did not hear any pops or abnormal sounds.   Fever : no  Sore throat: no   Cough: no Appetite: normal  Hydration: normal  Abdominal pain: no Nausea: no Vomiting: no Sleep disturbance: no  Back Pain: no Headache: no     Past Medical History:  Diagnosis Date   Allergy    past hx    Diabetes mellitus without complication ([place])    Hypertension     Patient Active Problem List   Diagnosis Date Noted   Uncontrolled type 2 diabetes mellitus with hyperglycemia ([place]) 05/16/2018    Past Surgical History:  Procedure Laterality Date   NO PAST SURGERIES         Home Medications    Prior to Admission medications   Medication Sig Start Date End Date Taking? Authorizing Provider  amLODipine (NORVASC) 10 MG tablet Take 10 mg by mouth daily.   Yes [provider]  atorvastatin (LIPITOR) 10 MG tablet Take 10 mg by mouth daily at 6 PM.   Yes [provider]  atorvastatin (LIPITOR) 20 MG tablet Take 20 mg by mouth at bedtime. 08/06/22  Yes [provider]  gabapentin (NEURONTIN) 100 MG capsule Take 1 capsule (100 mg total) by mouth 3 (three) times daily. 08/16/22  Yes Namiah Dunnavant, DO  insulin glargine (LANTUS SOLOSTAR)  100 UNIT/ML Solostar Pen Inject into the skin. 04/26/22  Yes [provider]  lisinopril (PRINIVIL,ZESTRIL) 40 MG tablet Take 40 mg by mouth daily.   Yes [provider]  naproxen (NAPROSYN) 500 MG tablet Take 1 tablet (500 mg total) by mouth 2 (two) times daily. 08/16/22  Yes Lymon Kidney, DO  Dulaglutide (TRULICITY) 1.5 ZL/9.3TT SOPN Inject into the skin.    [provider]  glipiZIDE (GLUCOTROL) 10 MG tablet Take 10 mg by mouth daily before breakfast.    [provider]  meloxicam (MOBIC) 15 MG tablet Take 1 tablet (15 mg total) by mouth daily as needed. 05/09/20   Marylene Land, NP  METFORMIN HCL PO Take 250 mg by mouth as needed.  05/09/20  [provider]  metoprolol succinate (TOPROL-XL) 25 MG 24 hr tablet Take 25 mg by mouth daily.  05/09/20  [provider]  metoprolol tartrate (LOPRESSOR) 25 MG tablet Take 25 mg by mouth daily.  05/09/20  [provider]    Family History Family History  Problem Relation Age of Onset   Prostate cancer Father    Colon cancer Neg Hx    Colon polyps Neg Hx    Esophageal cancer Neg Hx    Rectal cancer Neg Hx  Stomach cancer Neg Hx     Social History Social History   Tobacco Use   Smoking status: Former    Types: Cigarettes    Quit date: 2010    Years since quitting: 13.6   Smokeless tobacco: Never   Tobacco comments:    very occassional former smoker  Scientific laboratory technician Use: Never used  Substance Use Topics   Alcohol use: Yes    Comment: occasionally    Drug use: No     Allergies   Patient has no known allergies.   Review of Systems Review of Systems: :negative unless otherwise stated in HPI.      Physical Exam Triage Vital Signs ED Triage Vitals  Enc Vitals Group     BP 08/16/22 1125 (S) (!) 139/95     Pulse Rate 08/16/22 1125 65     Resp --      Temp 08/16/22 1125 98.1 F (36.7 C)     Temp Source 08/16/22 1125 Oral     SpO2 08/16/22 1125 98 %      Weight 08/16/22 1121 204 lb (92.5 kg)     Height 08/16/22 1121 '6\' 1"'$  (1.854 m)     Head Circumference --      Peak Flow --      Pain Score 08/16/22 1121 10     Pain Loc --      Pain Edu? --      Excl. in [place]? --    No data found.  Updated Vital Signs BP (S) (!) 139/95 (BP Location: Left Arm)   Pulse 65   Temp 98.1 F (36.7 C) (Oral)   Ht '6\' 1"'$  (1.854 m)   Wt 92.5 kg   SpO2 98%   BMI 26.91 kg/m   Visual Acuity Right Eye Distance:   Left Eye Distance:   Bilateral Distance:    Right Eye Near:   Left Eye Near:    Bilateral Near:     Physical Exam GEN: well appearing male in no acute distress  CVS: well perfused  RESP: speaking in full sentences without pause, no respiratory distress  MSK: Right shoulder:  No evidence of bony deformity, asymmetry, or muscle atrophy. No tenderness over long head of biceps (bicipital groove).  No TTP at [facility] joint.  Full active and passive (ABD, ADD, Flexion, extension, IR, ER).  Strength 5/5 grip, elbow and shoulder.  No abnormal scapular function observed.  Special Tests: Hawkins: equivacal; Empty Can: negative, Neer's: positive; Painful arc: Negative; Anterior Apprehension: Negative Sensation intact. Peripheral pulses intact.   UC Treatments / Results  Labs (all labs ordered are listed, but only abnormal results are displayed) Labs Reviewed - No data to display  EKG   Radiology DG Shoulder Right  Result Date: 08/16/2022 CLINICAL DATA:  Right shoulder pain EXAM: RIGHT SHOULDER - 2+ VIEW COMPARISON:  None Available. FINDINGS: There is no evidence of fracture or dislocation. There is no evidence of arthropathy or other focal bone abnormality. Soft tissues are unremarkable. IMPRESSION: Negative. Electronically Signed   By: Rolm Baptise M.D.   On: 08/16/2022 11:47    Procedures Procedures (including critical care time)  Medications Ordered in UC Medications  ketorolac (TORADOL) injection 30 mg (30 mg Intramuscular Given 08/16/22  1204)    Initial Impression / Assessment and Plan / UC Course  I have reviewed the triage vital signs and the nursing notes.  Pertinent labs & imaging results that were available during my care of the  patient were reviewed by me and considered in my medical decision making (see chart for details).       Pt is a 56 y.o.  male with 5 days of right shoulder pain without known injury.  Obtained left shoulder films.  Personally reviewed by me were unremarkable for fracture or dislocation.  Exam is concerning for a shoulder impingement.  Not likely rotator cuff tear based on exam.  Discussed follow-up with orthopedic provider and he is agreeable.  Naprosyn twice daily and gabapentin 3 times daily as needed for pain control.  Tylenol as needed.   Discussed MDM, treatment plan and plan for follow-up with patient/parent who agrees with plan.   Final Clinical Impressions(s) / UC Diagnoses   Final diagnoses:  Acute pain of right shoulder     Discharge Instructions      Stop by the pharmacy to pick up your prescriptions.  Be sure to take your gabapentin at bedtime to see how it will make you feel, if taken throughout the day.  If the pain persists after the next 2 weeks I would follow-up with an orthopedic provider.  There is an [facility] clinic in [place]. Also the local [facility] clinic may also see you.     ED Prescriptions     Medication Sig Dispense Auth. Provider   gabapentin (NEURONTIN) 100 MG capsule Take 1 capsule (100 mg total) by mouth 3 (three) times daily. 30 capsule Evelise Reine, DO   naproxen (NAPROSYN) 500 MG tablet Take 1 tablet (500 mg total) by mouth 2 (two) times daily. 30 tablet Dayra Rapley, Ronnette Juniper, DO      I have reviewed the PDMP during this encounter.   Lyndee Hensen, DO 08/17/22 1639

## 2022-08-16 NOTE — ED Triage Notes (Signed)
Patient reports that his right shoulder pain. Patient reports pain when he moves it and it radiates up neck and down that arm.   Patient reports that this started about 3-5 days ago.

## 2022-08-16 NOTE — Discharge Instructions (Addendum)
Stop by the pharmacy to pick up your prescriptions.  Be sure to take your gabapentin at bedtime to see how it will make you feel, if taken throughout the day.  If the pain persists after the next 2 weeks I would follow-up with an orthopedic provider.  There is an [facility] clinic in [place]. Also the local [facility] clinic may also see you.

## 2022-08-17 ENCOUNTER — Encounter: Payer: Self-pay | Admitting: Family Medicine

## 2022-10-16 ENCOUNTER — Other Ambulatory Visit: Payer: Self-pay | Admitting: Internal Medicine

## 2022-10-16 DIAGNOSIS — M25511 Pain in right shoulder: Secondary | ICD-10-CM

## 2022-12-09 ENCOUNTER — Ambulatory Visit
Admission: RE | Admit: 2022-12-09 | Discharge: 2022-12-09 | Disposition: A | Discharge status: Home / Self Care | Payer: BC Managed Care – PPO | Source: Ambulatory Visit | Attending: Internal Medicine | Admitting: Internal Medicine

## 2022-12-09 DIAGNOSIS — M25511 Pain in right shoulder: Secondary | ICD-10-CM

## 2025-01-28 ENCOUNTER — Ambulatory Visit: Admission: EM | Admit: 2025-01-28 | Discharge: 2025-01-28 | Disposition: A | Discharge status: Home / Self Care

## 2025-01-28 DIAGNOSIS — R10A2 Flank pain, left side: Secondary | ICD-10-CM

## 2025-01-28 DIAGNOSIS — M546 Pain in thoracic spine: Secondary | ICD-10-CM | POA: Diagnosis not present

## 2025-01-28 LAB — POCT URINE DIPSTICK
Bilirubin, UA: NEGATIVE
Blood, UA: NEGATIVE
Glucose, UA: 500 mg/dL — AB
Ketones, POC UA: NEGATIVE mg/dL
Leukocytes, UA: NEGATIVE
Nitrite, UA: NEGATIVE
Protein Ur, POC: NEGATIVE mg/dL
Spec Grav, UA: 1.02
Urobilinogen, UA: 0.2 U/dL
pH, UA: 5.5

## 2025-01-28 MED ORDER — BACLOFEN 10 MG PO TABS: 10.0000 mg | ORAL_TABLET | Freq: Three times a day (TID) | ORAL | 0 refills | Status: AC

## 2025-01-28 MED ORDER — IBUPROFEN 600 MG PO TABS: 600.0000 mg | ORAL_TABLET | Freq: Four times a day (QID) | ORAL | 0 refills | Status: AC | PRN

## 2025-01-28 NOTE — Discharge Instructions (Signed)

## 2025-01-28 NOTE — ED Provider Notes (Signed)
 " [facility]    CSN: [id number] Arrival date & time: 01/28/25  1451      History   Chief Complaint Chief Complaint  Patient presents with   Flank Pain    HPI Dustin Patterson is a 59 y.o. male.   HPI  59 year old male with past medical history significant for hypertension and diabetes presents for evaluation of 3 days worth of constant, sharp, 9/10 left flank pain that does not radiate.  The pain does increase with movement.  No painful urination, urgency or frequency of urination, hematuria.  No history of heavy lifting or injury.  No history of renal stones.  Past Medical History:  Diagnosis Date   Allergy    past hx    Diabetes mellitus without complication ([facility])    Hypertension     Patient Active Problem List   Diagnosis Date Noted   Uncontrolled type 2 diabetes mellitus with hyperglycemia ([facility]) 05/16/2018    Past Surgical History:  Procedure Laterality Date   NO PAST SURGERIES         Home Medications    Prior to Admission medications  Medication Sig Start Date End Date Taking? Authorizing Provider  amLODipine (NORVASC) 10 MG tablet Take 10 mg by mouth daily.   Yes [provider]  atorvastatin (LIPITOR) 20 MG tablet Take 20 mg by mouth at bedtime. 08/06/22  Yes [provider]  baclofen  (LIORESAL ) 10 MG tablet Take 1 tablet (10 mg total) by mouth 3 (three) times daily. 01/28/25  Yes Bernardino Ditch, NP  ibuprofen  (ADVIL ) 600 MG tablet Take 1 tablet (600 mg total) by mouth every 6 (six) hours as needed. 01/28/25  Yes Bernardino Ditch, NP  insulin glargine (LANTUS SOLOSTAR) 100 UNIT/ML Solostar Pen Inject into the skin. 04/26/22  Yes [provider]  insulin lispro (HUMALOG) 100 UNIT/ML KwikPen USE SLINDING SCALE STARTING AT 200 UNITS 10/14/24  Yes [provider]  lisinopril (ZESTRIL) 20 MG tablet Take 20 mg by mouth daily.   Yes [provider]  naproxen  (NAPROSYN ) 500 MG tablet Take 1 tablet (500 mg total) by  mouth 2 (two) times daily. 08/16/22   Brimage, Vondra, DO  METFORMIN HCL PO Take 250 mg by mouth as needed.  05/09/20  [provider]  metoprolol succinate (TOPROL-XL) 25 MG 24 hr tablet Take 25 mg by mouth daily.  05/09/20  [provider]  metoprolol tartrate (LOPRESSOR) 25 MG tablet Take 25 mg by mouth daily.  05/09/20  [provider]    Family History Family History  Problem Relation Age of Onset   Prostate cancer Father    Colon cancer Neg Hx    Colon polyps Neg Hx    Esophageal cancer Neg Hx    Rectal cancer Neg Hx    Stomach cancer Neg Hx     Social History Social History[1]   Allergies   Patient has no known allergies.   Review of Systems Review of Systems  Constitutional:  Negative for fever.  Genitourinary:  Positive for flank pain. Negative for dysuria, frequency, hematuria and urgency.  Musculoskeletal:  Positive for back pain.     Physical Exam Triage Vital Signs ED Triage Vitals [01/28/25 1620]  Encounter Vitals Group     BP      Girls Systolic BP Percentile      Girls Diastolic BP Percentile      Boys Systolic BP Percentile      Boys Diastolic BP Percentile  Pulse      Resp      Temp      Temp src      SpO2      Weight      Height      Head Circumference      Peak Flow      Pain Score 9     Pain Loc      Pain Education      Exclude from Growth Chart    No data found.  Updated Vital Signs BP (!) 153/94 (BP Location: Right Arm)   Pulse 77   Temp 98.2 F (36.8 C) (Oral)   Resp 18   SpO2 95%   Visual Acuity Right Eye Distance:   Left Eye Distance:   Bilateral Distance:    Right Eye Near:   Left Eye Near:    Bilateral Near:     Physical Exam Vitals and nursing note reviewed.  Constitutional:      Appearance: Normal appearance. He is not ill-appearing.  HENT:     Head: Normocephalic and atraumatic.  Cardiovascular:     Rate and Rhythm: Normal rate and regular rhythm.     Pulses: Normal pulses.      Heart sounds: Normal heart sounds. No murmur heard.    No friction rub. No gallop.  Pulmonary:     Effort: Pulmonary effort is normal.     Breath sounds: Normal breath sounds. No wheezing, rhonchi or rales.  Abdominal:     Tenderness: There is left CVA tenderness. There is no right CVA tenderness.  Musculoskeletal:        General: Tenderness present.  Skin:    General: Skin is warm and dry.     Capillary Refill: Capillary refill takes less than 2 seconds.     Findings: No erythema or rash.  Neurological:     General: No focal deficit present.     Mental Status: He is alert and oriented to person, place, and time.      UC Treatments / Results  Labs (all labs ordered are listed, but only abnormal results are displayed) Labs Reviewed  POCT URINE DIPSTICK - Abnormal; Notable for the following components:      Result Value   Clarity, UA cloudy (*)    Glucose, UA =500 (*)    All other components within normal limits    EKG   Radiology No results found.  Procedures Procedures (including critical care time)  Medications Ordered in UC Medications - No data to display  Initial Impression / Assessment and Plan / UC Course  I have reviewed the triage vital signs and the nursing notes.  Pertinent labs & imaging results that were available during my care of the patient were reviewed by me and considered in my medical decision making (see chart for details).   Patient is a nontoxic-appearing 59 year old male presenting for evaluation of 3 days worth of nonradiating left flank pain as outlined in the HPI above.  No previous history of renal stones and he is denying any urinary symptoms.  He does have some mild CVA tenderness on the left as well as tenderness with palpation of the lateral mid and lower thoracic paraspinous muscle group.  Differential diagnosis includes UTI, pyelonephritis, renal stone, musculoskeletal back pain.  I will order urinalysis to assess for the presence of  infection or blood which may indicate a stone.  Urine dip shows cloudy appearance with 500 glucose but no blood, nitrites, or leukocyte  esterase.  I discussed the patient's glucose in his urine and he indicates that he is taking Lantus and Humalog but he is not on any SGLT2 inhibitors.  His fasting glucose runs approximately 230 and he knows that he runs high.  He sees a PCP who is not on epic so I am unable to see their records.  I will discharge patient home with a diagnosis of musculoskeletal back pain with a prescription for 60 mg ibuprofen  along with 10 mg of baclofen .  Home physical therapy exercises.  Moist heat application.  Return precautions reviewed.   Final Clinical Impressions(s) / UC Diagnoses   Final diagnoses:  Left flank pain  Acute left-sided thoracic back pain     Discharge Instructions      Take the ibuprofen , 600 mg every 6 hours with food, on a schedule for the next 48 hours and then as needed.  Take the baclofen , 10 mg every 8 hours, on a schedule for the next 48 hours and then as needed.  Apply moist heat to your back for 30 minutes at a time 2-3 times a day to improve blood flow to the area and help remove the lactic acid causing the spasm.  Follow the back exercises given at discharge.  Return for reevaluation for any new or worsening symptoms.      ED Prescriptions     Medication Sig Dispense Auth. Provider   ibuprofen  (ADVIL ) 600 MG tablet Take 1 tablet (600 mg total) by mouth every 6 (six) hours as needed. 30 tablet Bernardino Ditch, NP   baclofen  (LIORESAL ) 10 MG tablet Take 1 tablet (10 mg total) by mouth 3 (three) times daily. 30 each Bernardino Ditch, NP      PDMP not reviewed this encounter.    [1]  Social History Tobacco Use   Smoking status: Former    Current packs/day: 0.00    Types: Cigarettes    Quit date: 2010    Years since quitting: 16.0   Smokeless tobacco: Never   Tobacco comments:    very occassional former smoker  Vaping Use    Vaping status: Never Used  Substance Use Topics   Alcohol use: Yes    Comment: occasionally    Drug use: No     Bernardino Ditch, NP 01/28/25 1639  "

## 2025-01-28 NOTE — ED Triage Notes (Signed)
 Left flank pain onset 3 days ago. Denies any urinary symptoms. Onset of pain is random, denies any strain, lifting, or injury.   Patient tried tylenol  with no relief.
